# Patient Record
Sex: Female | Born: 1980
Health system: Southern US, Community
[De-identification: ages and names within clinical notes are randomized; demographics above are authoritative.]

## PROBLEM LIST (undated history)

## (undated) DIAGNOSIS — E039 Hypothyroidism, unspecified: Secondary | ICD-10-CM

## (undated) DIAGNOSIS — I10 Essential (primary) hypertension: Secondary | ICD-10-CM

## (undated) DIAGNOSIS — K219 Gastro-esophageal reflux disease without esophagitis: Secondary | ICD-10-CM

## (undated) DIAGNOSIS — T753XXA Motion sickness, initial encounter: Secondary | ICD-10-CM

## (undated) DIAGNOSIS — E079 Disorder of thyroid, unspecified: Secondary | ICD-10-CM

## (undated) HISTORY — DX: Disorder of thyroid, unspecified: E07.9

## (undated) HISTORY — PX: WISDOM TOOTH EXTRACTION: SHX21

---

## 2013-05-27 ENCOUNTER — Ambulatory Visit: Payer: Self-pay

## 2014-05-26 DIAGNOSIS — IMO0002 Reserved for concepts with insufficient information to code with codable children: Secondary | ICD-10-CM

## 2014-05-26 DIAGNOSIS — Z6830 Body mass index (BMI) 30.0-30.9, adult: Secondary | ICD-10-CM | POA: Insufficient documentation

## 2014-05-26 DIAGNOSIS — Z6835 Body mass index (BMI) 35.0-35.9, adult: Secondary | ICD-10-CM | POA: Insufficient documentation

## 2015-02-03 ENCOUNTER — Encounter: Payer: Self-pay | Admitting: Internal Medicine

## 2015-02-03 ENCOUNTER — Other Ambulatory Visit: Payer: Self-pay | Admitting: Internal Medicine

## 2015-02-03 DIAGNOSIS — R739 Hyperglycemia, unspecified: Secondary | ICD-10-CM | POA: Insufficient documentation

## 2015-02-03 DIAGNOSIS — E039 Hypothyroidism, unspecified: Secondary | ICD-10-CM | POA: Insufficient documentation

## 2015-02-03 DIAGNOSIS — J3081 Allergic rhinitis due to animal (cat) (dog) hair and dander: Secondary | ICD-10-CM | POA: Insufficient documentation

## 2015-02-03 HISTORY — DX: Hyperglycemia, unspecified: R73.9

## 2015-03-02 ENCOUNTER — Encounter: Payer: Self-pay | Admitting: Internal Medicine

## 2015-03-02 ENCOUNTER — Ambulatory Visit (INDEPENDENT_AMBULATORY_CARE_PROVIDER_SITE_OTHER): Payer: BLUE CROSS/BLUE SHIELD | Admitting: Internal Medicine

## 2015-03-02 VITALS — BP 110/68 | HR 84 | Ht 70.0 in | Wt 215.6 lb

## 2015-03-02 DIAGNOSIS — R7303 Prediabetes: Secondary | ICD-10-CM

## 2015-03-02 DIAGNOSIS — R739 Hyperglycemia, unspecified: Secondary | ICD-10-CM | POA: Diagnosis not present

## 2015-03-02 DIAGNOSIS — E039 Hypothyroidism, unspecified: Secondary | ICD-10-CM | POA: Diagnosis not present

## 2015-03-02 HISTORY — DX: Prediabetes: R73.03

## 2015-03-02 MED ORDER — LEVOTHYROXINE SODIUM 125 MCG PO TABS: 125.0000 ug | ORAL_TABLET | Freq: Every day | ORAL | Status: DC

## 2015-03-02 NOTE — Progress Notes (Signed)
Date:  03/02/2015   Name:  Alicia Horn   DOB:  [birth year]   MRN:  [id number]   Chief Complaint: Hypothyroidism Thyroid Problem Presents for follow-up visit. Patient reports no anxiety, cold intolerance, constipation, depressed mood, diarrhea, fatigue, leg swelling, menstrual problem or tremors. The symptoms have been stable. Past treatments include levothyroxine. The treatment provided significant relief.  Elevated BS with borderline A1C - previously noted to have borderline blood sugar and A1C 5.8.  She has been working on diet and losing weight steadily.  Her labs have not been checked in at least one year. HM -  She sees GYN at [facility] once a year for Pap, pelvic and mammograms.  She did not have blood work this year.   Review of Systems  Constitutional: Negative for fever, fatigue and unexpected weight change.  HENT: Negative for tinnitus, trouble swallowing and voice change.   Eyes: Negative for visual disturbance.  Respiratory: Negative for chest tightness, shortness of breath and wheezing.   Gastrointestinal: Negative for diarrhea, constipation and blood in stool.  Endocrine: Negative for cold intolerance.  Genitourinary: Negative for menstrual problem.  Musculoskeletal: Negative for back pain and arthralgias.  Neurological: Negative for tremors, light-headedness and headaches.  Psychiatric/Behavioral: The patient is not nervous/anxious.     Patient Active Problem List   Diagnosis Date Noted  . Borderline diabetes mellitus 03/02/2015  . Laboratory animal allergy 02/03/2015  . Acquired hypothyroidism 02/03/2015  . Blood glucose elevated 02/03/2015  . Adult BMI 30+ 05/26/2014    Prior to Admission medications   Medication Sig Start Date End Date Taking? Authorizing Provider  levothyroxine (SYNTHROID, LEVOTHROID) 125 MCG tablet Take 1 tablet by mouth daily. 03/05/14   Historical Provider, MD    Allergies  Allergen Reactions  . Amoxicillin Other (See Comments)  .  Cefuroxime Axetil Diarrhea and Nausea And Vomiting    Other reaction(s): Nausea    No past surgical history on file.  Social History  Substance Use Topics  . Smoking status: Never Smoker   . Smokeless tobacco: None  . Alcohol Use: 1.2 oz/week    2 Standard drinks or equivalent per week    Medication list has been reviewed and updated.   Physical Exam  Constitutional: She is oriented to person, place, and time. She appears well-developed. No distress.  HENT:  Head: Normocephalic and atraumatic.  Right Ear: Tympanic membrane normal.  Left Ear: Tympanic membrane normal.  Mouth/Throat: Oropharynx is clear and moist.  Neck: Normal range of motion. Neck supple. No thyromegaly present.  Cardiovascular: Normal rate, regular rhythm and normal heart sounds.   Pulmonary/Chest: Effort normal and breath sounds normal. No respiratory distress. She has no wheezes.  Musculoskeletal: Normal range of motion. She exhibits no edema.  Lymphadenopathy:    She has no cervical adenopathy.  Neurological: She is alert and oriented to person, place, and time.  Skin: Skin is warm and dry. No rash noted.  Psychiatric: She has a normal mood and affect. Her behavior is normal. Thought content normal.  Nursing note and vitals reviewed.   BP 110/68 mmHg  Pulse 84  Ht 5\' 10"  (1.778 m)  Wt 215 lb 9.6 oz (97.796 kg)  BMI 30.94 kg/m2  Assessment and Plan: 1. Acquired hypothyroidism supplemented - TSH - Lipid panel - levothyroxine (SYNTHROID, LEVOTHROID) 125 MCG tablet; Take 1 tablet (125 mcg total) by mouth daily.  Dispense: 90 tablet; Refill: 3  2. Blood glucose elevated Should be improved with weight loss - Comprehensive  metabolic panel - Hemoglobin A1c   Halina Maidens, MD [facility]  03/02/2015

## 2015-03-03 LAB — COMPREHENSIVE METABOLIC PANEL
A/G RATIO: 1.7 (ref 1.1–2.5)
ALT: 18 IU/L (ref 0–32)
AST: 16 IU/L (ref 0–40)
Albumin: 4.5 g/dL (ref 3.5–5.5)
Alkaline Phosphatase: 61 IU/L (ref 39–117)
BILIRUBIN TOTAL: 0.3 mg/dL (ref 0.0–1.2)
BUN/Creatinine Ratio: 22 — ABNORMAL HIGH (ref 8–20)
BUN: 14 mg/dL (ref 6–20)
CHLORIDE: 98 mmol/L (ref 96–106)
CO2: 22 mmol/L (ref 18–29)
Calcium: 8.7 mg/dL (ref 8.7–10.2)
Creatinine, Ser: 0.65 mg/dL (ref 0.57–1.00)
GFR calc non Af Amer: 115 mL/min/{1.73_m2} (ref >59)
GFR, EST AFRICAN AMERICAN: 132 mL/min/{1.73_m2} (ref >59)
GLOBULIN, TOTAL: 2.6 g/dL (ref 1.5–4.5)
Glucose: 77 mg/dL (ref 65–99)
POTASSIUM: 4.7 mmol/L (ref 3.5–5.2)
SODIUM: 138 mmol/L (ref 134–144)
Total Protein: 7.1 g/dL (ref 6.0–8.5)

## 2015-03-03 LAB — LIPID PANEL
CHOL/HDL RATIO: 3.3 ratio (ref 0.0–4.4)
CHOLESTEROL TOTAL: 182 mg/dL (ref 100–199)
HDL: 55 mg/dL (ref >39)
LDL Calculated: 107 mg/dL — ABNORMAL HIGH (ref 0–99)
TRIGLYCERIDES: 100 mg/dL (ref 0–149)
VLDL Cholesterol Cal: 20 mg/dL (ref 5–40)

## 2015-03-03 LAB — HEMOGLOBIN A1C
ESTIMATED AVERAGE GLUCOSE: 117 mg/dL
Hgb A1c MFr Bld: 5.7 % — ABNORMAL HIGH (ref 4.8–5.6)

## 2015-03-03 LAB — TSH: TSH: 2.35 u[IU]/mL (ref 0.450–4.500)

## 2015-09-21 ENCOUNTER — Encounter: Payer: Self-pay | Admitting: Internal Medicine

## 2015-09-21 ENCOUNTER — Other Ambulatory Visit: Payer: Self-pay | Admitting: Internal Medicine

## 2015-09-21 ENCOUNTER — Other Ambulatory Visit: Payer: Self-pay

## 2015-09-21 ENCOUNTER — Ambulatory Visit (INDEPENDENT_AMBULATORY_CARE_PROVIDER_SITE_OTHER): Payer: BLUE CROSS/BLUE SHIELD | Admitting: Internal Medicine

## 2015-09-21 VITALS — BP 118/81 | HR 81 | Temp 98.4°F | Resp 16 | Ht 70.0 in | Wt 222.0 lb

## 2015-09-21 DIAGNOSIS — K439 Ventral hernia without obstruction or gangrene: Secondary | ICD-10-CM | POA: Diagnosis not present

## 2015-09-21 DIAGNOSIS — L0390 Cellulitis, unspecified: Secondary | ICD-10-CM | POA: Diagnosis not present

## 2015-09-21 LAB — POCT URINALYSIS DIPSTICK
Bilirubin, UA: NEGATIVE
GLUCOSE UA: NEGATIVE
Ketones, UA: NEGATIVE
Leukocytes, UA: NEGATIVE
NITRITE UA: NEGATIVE
PROTEIN UA: NEGATIVE
Spec Grav, UA: 1.02
pH, UA: 6.5

## 2015-09-21 NOTE — Progress Notes (Signed)
    Date:  09/21/2015   Name:  Alicia Horn   DOB:  1981-03-05   MRN:  [id number]   Chief Complaint: Abdominal Pain Abdominal Pain This is a new problem. The current episode started 1 to 4 weeks ago. The onset quality is gradual. The problem occurs daily. The problem has been unchanged. The pain is located in the periumbilical region. The pain is at a severity of 0/10. The quality of the pain is a sensation of fullness and cramping. The abdominal pain does not radiate. Pertinent negatives include no diarrhea, fever, headaches, vomiting or weight loss. The pain is aggravated by movement.      Review of Systems  Constitutional: Negative for fever and weight loss.  Gastrointestinal: Positive for abdominal pain. Negative for vomiting and diarrhea.  Neurological: Negative for headaches.  All other systems reviewed and are negative.   Patient Active Problem List   Diagnosis Date Noted  . Borderline diabetes mellitus 03/02/2015  . Laboratory animal allergy 02/03/2015  . Acquired hypothyroidism 02/03/2015  . Blood glucose elevated 02/03/2015  . Adult BMI 30+ 05/26/2014    Prior to Admission medications   Medication Sig Start Date End Date Taking? Authorizing Provider  levothyroxine (SYNTHROID, LEVOTHROID) 125 MCG tablet Take 1 tablet (125 mcg total) by mouth daily. 03/02/15  Yes Glean Hess, MD    Allergies  Allergen Reactions  . Amoxicillin Other (See Comments)  . Cefuroxime Axetil Diarrhea and Nausea And Vomiting    Other reaction(s): Nausea    History reviewed. No pertinent past surgical history.  Social History  Substance Use Topics  . Smoking status: Never Smoker   . Smokeless tobacco: None  . Alcohol Use: 1.2 oz/week    2 Standard drinks or equivalent per week     Medication list has been reviewed and updated.   Physical Exam  Constitutional: She is oriented to person, place, and time. She appears well-developed. No distress.  HENT:  Head: Normocephalic  and atraumatic.  Cardiovascular: Normal rate, regular rhythm and normal heart sounds.   Pulmonary/Chest: Effort normal. No respiratory distress.  Abdominal: Soft. Normal appearance and bowel sounds are normal. There is no hepatosplenomegaly. There is tenderness in the periumbilical area. There is no CVA tenderness.    Musculoskeletal: Normal range of motion.  Neurological: She is alert and oriented to person, place, and time.  Skin: Skin is warm and dry. No rash noted.  Psychiatric: She has a normal mood and affect. Her behavior is normal. Thought content normal.  Nursing note and vitals reviewed.   BP 118/81 mmHg  Pulse 81  Temp(Src) 98.4 F (36.9 C) (Oral)  Resp 16  Ht 5\' 10"  (1.778 m)  Wt 222 lb (100.699 kg)  BMI 31.85 kg/m2  SpO2 98%  LMP 09/17/2015  Assessment and Plan: 1. Ventral hernia, recurrence not specified Suspected - will refer to Gen Surgery to further evaluate - POCT urinalysis dipstick - Ambulatory referral to General Surgery  2. Cellulitis, unspecified cellulitis site, unspecified extremity site, unspecified laterality Clean with H2O2 and apply TAO daily   Halina Maidens, MD [facility]  09/21/2015

## 2015-09-27 ENCOUNTER — Ambulatory Visit: Payer: Self-pay | Admitting: Surgery

## 2015-09-27 ENCOUNTER — Ambulatory Visit
Admission: RE | Admit: 2015-09-27 | Discharge: 2015-09-27 | Disposition: A | Discharge status: Home / Self Care | Payer: BLUE CROSS/BLUE SHIELD | Source: Ambulatory Visit | Attending: Surgery | Admitting: Surgery

## 2015-09-27 ENCOUNTER — Ambulatory Visit (INDEPENDENT_AMBULATORY_CARE_PROVIDER_SITE_OTHER): Payer: BLUE CROSS/BLUE SHIELD | Admitting: Surgery

## 2015-09-27 ENCOUNTER — Encounter: Payer: Self-pay | Admitting: Surgery

## 2015-09-27 VITALS — BP 110/65 | HR 84 | Temp 98.9°F | Ht 70.0 in | Wt 220.0 lb

## 2015-09-27 DIAGNOSIS — R1012 Left upper quadrant pain: Secondary | ICD-10-CM

## 2015-09-27 DIAGNOSIS — D259 Leiomyoma of uterus, unspecified: Secondary | ICD-10-CM | POA: Insufficient documentation

## 2015-09-27 DIAGNOSIS — R1033 Periumbilical pain: Secondary | ICD-10-CM | POA: Diagnosis not present

## 2015-09-27 MED ORDER — IOPAMIDOL (ISOVUE-370) INJECTION 76%
125.0000 mL | Freq: Once | INTRAVENOUS | Status: AC | PRN
Start: 2015-09-27 — End: 2015-09-27
  Administered 2015-09-27: 125 mL via INTRAVENOUS

## 2015-09-27 NOTE — Progress Notes (Signed)
Surgical Consultation  09/27/2015  Alicia Horn is an 35 y.o. female.   CC: Umbilical pain  HPI: Is a patient with periumbilical pain but no mass is been going on for several months and in fact she's had drainage from her umbilicus on and off since March with no fevers or chills. She did have some erythema apparently at one point but has not been treated with antibiotics. He studies. Her pain comes and goes and she points more to the left of the umbilicus as the site of her pain. He has not noticed a mass  Past Medical History:  Diagnosis Date  . Thyroid disease     Past Surgical History:  Procedure Laterality Date  . WISDOM TOOTH EXTRACTION      Family History  Problem Relation Age of Onset  . Hyperlipidemia Father   . CAD Father     Social History:  reports that she has never smoked. She does not have any smokeless tobacco history on file. She reports that she drinks about 1.2 oz of alcohol per week . Her drug history is not on file.  Allergies:  Allergies  Allergen Reactions  . Amoxicillin Other (See Comments)  . Cefuroxime Axetil Diarrhea and Nausea And Vomiting    Other reaction(s): Nausea    Medications reviewed.   Review of Systems:   Review of Systems  Constitutional: Negative for chills and fever.  HENT: Negative.   Eyes: Negative.   Respiratory: Negative.   Cardiovascular: Negative.   Gastrointestinal: Positive for abdominal pain. Negative for blood in stool, constipation, diarrhea, heartburn, nausea and vomiting.  Genitourinary: Negative.   Musculoskeletal: Negative.   Skin: Negative.   Neurological: Negative.   Endo/Heme/Allergies: Negative.   Psychiatric/Behavioral: Negative.      Physical Exam:  BP 110/65 (BP Location: Right Arm, Patient Position: Sitting, Cuff Size: Large)   Pulse 84   Temp 98.9 F (37.2 C) (Oral)   Ht 5\' 10"  (1.778 m)   Wt 220 lb (99.8 kg)   LMP 09/17/2015   BMI 31.57 kg/m   Physical Exam  Constitutional: She  is oriented to person, place, and time and well-developed, well-nourished, and in no distress. No distress.  HENT:  Head: Normocephalic and atraumatic.  Eyes: Right eye exhibits no discharge. Left eye exhibits no discharge. No scleral icterus.  Neck: Normal range of motion.  Cardiovascular: Normal rate, regular rhythm and normal heart sounds.   Pulmonary/Chest: Effort normal and breath sounds normal. No respiratory distress. She has no wheezes. She has no rales.  Abdominal: Soft. She exhibits no distension. There is no tenderness. There is no rebound and no guarding.   No erythema nontender no mass no obvious hernia Q tip in the depths of the umbilicus demonstrates yellowish purulent fluid of minimal quantity. Blood  Musculoskeletal: Normal range of motion. She exhibits no edema or tenderness.  Lymphadenopathy:    She has no cervical adenopathy.  Neurological: She is alert and oriented to person, place, and time.  Skin: Skin is warm and dry. No rash noted. She is not diaphoretic. No erythema.  Psychiatric: Mood and affect normal.  Vitals reviewed.     No results found for this or any previous visit (from the past 48 hour(s)). No results found.  Assessment/Plan:  Patient with periumbilical pain and drainage with a history of erythema I see no sign of a hernia at this point but no imaging is been performed. This may represent an umbilical granuloma. Other alternatives could be endometriosis  etc. The plan would be to obtain a CT scan to rule out a hernia as she has had not had any abdominal surgery it is unlikely that this would be an infected suture etc. With that in mind she will require exploration but I will discuss that with her after we obtain a CT scan.  Florene Glen, MD, FACS

## 2015-09-29 ENCOUNTER — Ambulatory Visit (INDEPENDENT_AMBULATORY_CARE_PROVIDER_SITE_OTHER): Payer: BLUE CROSS/BLUE SHIELD | Admitting: Surgery

## 2015-09-29 VITALS — BP 124/64 | HR 91 | Temp 98.4°F | Ht 70.0 in | Wt 221.0 lb

## 2015-09-29 DIAGNOSIS — R1033 Periumbilical pain: Secondary | ICD-10-CM | POA: Diagnosis not present

## 2015-09-29 NOTE — Progress Notes (Signed)
Outpatient Surgical Follow Up  09/29/2015  Alicia Horn is an 35 y.o. female.   CC umbilical drainage  HPI: This a patient with ongoing umbilical drainage and periumbilical pain for which there was a concern about a hernia. She had a CT scan performed which has been personally reviewed showing no sign of hernia. Patient's only complaint at this point his pain to the left of her umbilicus and drainage. He has had no abdominal surgery and has never had a problem with endometriosis.  Past Medical History:  Diagnosis Date  . Thyroid disease     Past Surgical History:  Procedure Laterality Date  . WISDOM TOOTH EXTRACTION      Family History  Problem Relation Age of Onset  . Hyperlipidemia Father   . CAD Father     Social History:  reports that she has never smoked. She does not have any smokeless tobacco history on file. She reports that she drinks about 1.2 oz of alcohol per week . Her drug history is not on file.  Allergies:  Allergies  Allergen Reactions  . Amoxicillin Other (See Comments)  . Cefuroxime Axetil Diarrhea and Nausea And Vomiting    Other reaction(s): Nausea    Medications reviewed.   Review of Systems:   Review of Systems  Constitutional: Negative.   HENT: Negative.   Eyes: Negative.   Respiratory: Negative.   Cardiovascular: Negative.   Gastrointestinal: Positive for abdominal pain. Negative for nausea and vomiting.  Genitourinary: Negative.   Musculoskeletal: Negative.   Skin: Negative.   Neurological: Negative.   Endo/Heme/Allergies: Negative.   Psychiatric/Behavioral: Negative.      Physical Exam:  LMP 09/17/2015   Physical Exam  Constitutional: She is oriented to person, place, and time and well-developed, well-nourished, and in no distress. No distress.  HENT:  Head: Normocephalic and atraumatic.  Eyes: Right eye exhibits no discharge. Left eye exhibits no discharge. No scleral icterus.  Neck: Normal range of motion.   Cardiovascular: Normal rate, regular rhythm and normal heart sounds.   Pulmonary/Chest: Effort normal and breath sounds normal. No respiratory distress. She has no wheezes. She has no rales.  Abdominal: Soft. She exhibits no distension. There is no tenderness.  Umbilical drainage with no erythema nontender no mass no hernia  Musculoskeletal: Normal range of motion. She exhibits no edema or tenderness.  Lymphadenopathy:    She has no cervical adenopathy.  Neurological: She is alert and oriented to person, place, and time.  Skin: Skin is warm. No rash noted. She is not diaphoretic. No erythema.  Umbilical drainage  Psychiatric: Mood and affect normal.  Vitals reviewed.     No results found for this or any previous visit (from the past 48 hour(s)). Ct Abdomen Pelvis W Contrast  Result Date: 09/27/2015 CLINICAL DATA:  35 year old female with history of mid abdominal (periumbilical) pain for the past 5 or 6 weeks. No history of trauma. Intermittent drainage from the umbilicus to since March 2017. No fevers or chills. EXAM: CT ABDOMEN AND PELVIS WITH CONTRAST TECHNIQUE: Multidetector CT imaging of the abdomen and pelvis was performed using the standard protocol following bolus administration of intravenous contrast. CONTRAST:  125 mL of Isovue 370. COMPARISON:  No priors. FINDINGS: Lower chest:  Unremarkable. Hepatobiliary: No cystic or solid hepatic lesions. No intra or extrahepatic biliary ductal dilatation. Gallbladder is normal in appearance. Pancreas: No pancreatic mass. No pancreatic ductal dilatation. No pancreatic or peripancreatic fluid or inflammatory changes. Spleen: Unremarkable. Adrenals/Urinary Tract: Bilateral adrenal glands and  bilateral kidneys are normal in appearance. No hydroureteronephrosis. Urinary bladder is normal in appearance. Specifically, no urachal anomaly is noted. Stomach/Bowel: The appearance of the stomach is normal. There is no pathologic dilatation of small bowel or  colon. Normal appendix. Vascular/Lymphatic: No significant atherosclerotic disease, aneurysm or dissection identified in the abdominal or pelvic vasculature. No lymphadenopathy noted in the abdomen or pelvis. Reproductive: Extending inferiorly from the right side of the uterine fundus there is a 3.2 x 3.1 x 3.2 cm heterogeneously enhancing lesion, most compatible with a small fibroid. Bilateral ovaries are unremarkable in appearance. Other: No significant volume of ascites.  No pneumoperitoneum. Musculoskeletal: There are no aggressive appearing lytic or blastic lesions noted in the visualized portions of the skeleton. Umbilical area is unremarkable in appearance. IMPRESSION: 1. No acute findings in the abdomen or pelvis to account for the patient's symptoms. 2. The umbilical area is grossly unremarkable in appearance. 3. No urachal abnormality or other findings to account for the recent drainage from the umbilicus. 4. Normal appendix. 5. Small fibroid in the right-sided the uterine fundus. Electronically Signed   By: Vinnie Langton M.D.   On: 09/27/2015 17:07   Assessment/Plan:  CT scan is personally reviewed showing no sign of hernia but clinically she has an umbilical granuloma which is bothersome painful and draining. It requires excision. This would be performed via a midline incision and elevation of the skin and possible fascial excision. The procedure was described for her in the rationale was discussed the options of observation were reviewed and the risk of bleeding infection cosmetic deformity and recurrence were all discussed with her she understood and agreed to proceed  Florene Glen, MD, FACS

## 2015-09-29 NOTE — Patient Instructions (Signed)
We will schedule your surgery for 10/14/15. Please see Blue-pre-care sheet. Call our office if you have questions or concerns.

## 2015-09-30 ENCOUNTER — Telehealth: Payer: Self-pay | Admitting: Surgery

## 2015-09-30 ENCOUNTER — Encounter
Admission: RE | Admit: 2015-09-30 | Discharge: 2015-09-30 | Disposition: A | Discharge status: Home / Self Care | Payer: BLUE CROSS/BLUE SHIELD | Source: Ambulatory Visit | Attending: Surgery | Admitting: Surgery

## 2015-09-30 DIAGNOSIS — Z01812 Encounter for preprocedural laboratory examination: Secondary | ICD-10-CM | POA: Insufficient documentation

## 2015-09-30 HISTORY — DX: Hypothyroidism, unspecified: E03.9

## 2015-09-30 LAB — CBC WITH DIFFERENTIAL/PLATELET
Basophils Absolute: 0 K/uL (ref 0–0.1)
Basophils Relative: 1 %
Eosinophils Absolute: 0.2 K/uL (ref 0–0.7)
Eosinophils Relative: 2 %
HCT: 41.5 % (ref 35.0–47.0)
Hemoglobin: 14.3 g/dL (ref 12.0–16.0)
Lymphocytes Relative: 20 %
Lymphs Abs: 1.7 K/uL (ref 1.0–3.6)
MCH: 30.1 pg (ref 26.0–34.0)
MCHC: 34.4 g/dL (ref 32.0–36.0)
MCV: 87.5 fL (ref 80.0–100.0)
Monocytes Absolute: 0.5 K/uL (ref 0.2–0.9)
Monocytes Relative: 5 %
Neutro Abs: 6.4 K/uL (ref 1.4–6.5)
Neutrophils Relative %: 72 %
Platelets: 295 K/uL (ref 150–440)
RBC: 4.74 MIL/uL (ref 3.80–5.20)
RDW: 12.8 % (ref 11.5–14.5)
WBC: 8.8 K/uL (ref 3.6–11.0)

## 2015-09-30 LAB — BASIC METABOLIC PANEL
Anion gap: 9 (ref 5–15)
BUN: 13 mg/dL (ref 6–20)
CALCIUM: 9.3 mg/dL (ref 8.9–10.3)
CHLORIDE: 102 mmol/L (ref 101–111)
CO2: 26 mmol/L (ref 22–32)
CREATININE: 0.71 mg/dL (ref 0.44–1.00)
GFR calc Af Amer: >60 mL/min (ref >60)
GFR calc non Af Amer: >60 mL/min (ref >60)
GLUCOSE: 95 mg/dL (ref 65–99)
Potassium: 4.2 mmol/L (ref 3.5–5.1)
Sodium: 137 mmol/L (ref 135–145)

## 2015-09-30 NOTE — Telephone Encounter (Signed)
Pt advised of pre op date/time and sx date. Sx: 10/14/15 with Dr Cooper--Exploratory laparotomy with excision of abdominal mass. Pre op: 09/30/15 @ 2:00pm--office.   Patient made aware to call [phone], between 1-3:00pm the day before surgery, to find out what time to arrive.

## 2015-09-30 NOTE — Patient Instructions (Signed)
Your procedure is scheduled on: Thursday 10/14/15 Report to Day Surgery. 2ND FLOOR MEDICAL MALL ENTRANCE To find out your arrival time please call [phone] between 1PM - 3PM on Wednesday 10/13/15.  Remember: Instructions that are not followed completely may result in serious medical risk, up to and including death, or upon the discretion of your surgeon and anesthesiologist your surgery may need to be rescheduled.    __X__ 1. Do not eat food or drink liquids after midnight. No gum chewing or hard candies.     __X__ 2. No Alcohol for 24 hours before or after surgery.   ____ 3. Bring all medications with you on the day of surgery if instructed.    __X__ 4. Notify your doctor if there is any change in your medical condition     (cold, fever, infections).     Do not wear jewelry, make-up, hairpins, clips or nail polish.  Do not wear lotions, powders, or perfumes.   Do not shave 48 hours prior to surgery. Men may shave face and neck.  Do not bring valuables to the hospital.    [facility] is not responsible for any belongings or valuables.               Contacts, dentures or bridgework may not be worn into surgery.  Leave your suitcase in the car. After surgery it may be brought to your room.  For patients admitted to the hospital, discharge time is determined by your                treatment team.   Patients discharged the day of surgery will not be allowed to drive home.   Please read over the following fact sheets that you were given:   Surgical Site Infection Prevention   __X__ Take these medicines the morning of surgery with A SIP OF WATER:    1. LEVOTHYROXINE  2.   3.   4.  5.  6.  ____ Fleet Enema (as directed)   __X__ Use CHG Soap as directed  ____ Use inhalers on the day of surgery  ____ Stop metformin 2 days prior to surgery    ____ Take 1/2 of usual insulin dose the night before surgery and none on the morning of surgery.   ____ Stop Coumadin/Plavix/aspirin on    ____ Stop Anti-inflammatories on    ____ Stop supplements until after surgery.    ____ Bring C-Pap to the hospital.

## 2015-10-04 ENCOUNTER — Other Ambulatory Visit: Payer: BLUE CROSS/BLUE SHIELD

## 2015-10-05 ENCOUNTER — Telehealth: Payer: Self-pay | Admitting: Surgery

## 2015-10-05 HISTORY — PX: UMBILICAL GRANULOMA EXCISION: SHX2597

## 2015-10-05 NOTE — Telephone Encounter (Signed)
Patient has called and is requesting a letter for work prior to her surgery.   Please fax the letter to the employer -- [employer] @ [phone] --Attention to Mortimer Fries.  Please include the surgery date and an estimated time out of work until the [employer] paperwork can be filled out. Her employer will need this prior to surgery and please call patient once this has been competed.

## 2015-10-05 NOTE — Telephone Encounter (Signed)
Called patient, however was unable to leave message due to mail box being full.  I wanted to let patient know I was faxing over the letter to her employer today.

## 2015-10-11 ENCOUNTER — Ambulatory Visit: Payer: BLUE CROSS/BLUE SHIELD | Admitting: Surgery

## 2015-10-14 ENCOUNTER — Ambulatory Visit: Payer: BLUE CROSS/BLUE SHIELD | Admitting: Anesthesiology

## 2015-10-14 ENCOUNTER — Encounter
Admission: RE | Disposition: A | Discharge status: Home / Self Care | Payer: Self-pay | Source: Ambulatory Visit | Attending: Surgery

## 2015-10-14 ENCOUNTER — Encounter: Payer: Self-pay | Admitting: *Deleted

## 2015-10-14 ENCOUNTER — Ambulatory Visit
Admission: RE | Admit: 2015-10-14 | Discharge: 2015-10-14 | Disposition: A | Discharge status: Home / Self Care | Payer: BLUE CROSS/BLUE SHIELD | Source: Ambulatory Visit | Attending: Surgery | Admitting: Surgery

## 2015-10-14 DIAGNOSIS — L929 Granulomatous disorder of the skin and subcutaneous tissue, unspecified: Secondary | ICD-10-CM | POA: Insufficient documentation

## 2015-10-14 DIAGNOSIS — E039 Hypothyroidism, unspecified: Secondary | ICD-10-CM | POA: Diagnosis not present

## 2015-10-14 DIAGNOSIS — K429 Umbilical hernia without obstruction or gangrene: Secondary | ICD-10-CM | POA: Diagnosis not present

## 2015-10-14 DIAGNOSIS — R1033 Periumbilical pain: Secondary | ICD-10-CM | POA: Diagnosis not present

## 2015-10-14 DIAGNOSIS — L919 Hypertrophic disorder of the skin, unspecified: Secondary | ICD-10-CM | POA: Diagnosis not present

## 2015-10-14 DIAGNOSIS — R109 Unspecified abdominal pain: Secondary | ICD-10-CM | POA: Diagnosis not present

## 2015-10-14 HISTORY — PX: EXPLORATORY LAPAROTOMY WITH ABDOMINAL MASS EXCISION: SHX5169

## 2015-10-14 LAB — POCT PREGNANCY, URINE: Preg Test, Ur: NEGATIVE

## 2015-10-14 SURGERY — LAPAROTOMY, EXPLORATORY, WITH ABDOMINAL MASS EXCISION
Anesthesia: General | Wound class: Clean

## 2015-10-14 MED ORDER — DEXAMETHASONE SODIUM PHOSPHATE 10 MG/ML IJ SOLN
INTRAMUSCULAR | Status: DC | PRN
  Administered 2015-10-14: 10 mg via INTRAVENOUS

## 2015-10-14 MED ORDER — SUCCINYLCHOLINE CHLORIDE 20 MG/ML IJ SOLN
INTRAMUSCULAR | Status: DC | PRN
  Administered 2015-10-14: 100 mg via INTRAVENOUS

## 2015-10-14 MED ORDER — OXYCODONE HCL 5 MG PO TABS
5.0000 mg | ORAL_TABLET | Freq: Once | ORAL | Status: DC | PRN
Start: 2015-10-14 — End: 2015-10-14

## 2015-10-14 MED ORDER — LIDOCAINE HCL (CARDIAC) 20 MG/ML IV SOLN
INTRAVENOUS | Status: DC | PRN
  Administered 2015-10-14: 100 mg via INTRAVENOUS

## 2015-10-14 MED ORDER — CHLORHEXIDINE GLUCONATE 4 % EX LIQD: 60.0000 mL | Freq: Once | CUTANEOUS | Status: DC

## 2015-10-14 MED ORDER — ACETAMINOPHEN 10 MG/ML IV SOLN
INTRAVENOUS | Status: DC | PRN
  Administered 2015-10-14: 1000 mg via INTRAVENOUS

## 2015-10-14 MED ORDER — PROPOFOL 10 MG/ML IV BOLUS
INTRAVENOUS | Status: DC | PRN
  Administered 2015-10-14: 180 mg via INTRAVENOUS

## 2015-10-14 MED ORDER — BUPIVACAINE HCL (PF) 0.25 % IJ SOLN
INTRAMUSCULAR | Status: AC
  Filled 2015-10-14: qty 30

## 2015-10-14 MED ORDER — MIDAZOLAM HCL 2 MG/2ML IJ SOLN
INTRAMUSCULAR | Status: DC | PRN
  Administered 2015-10-14: 2 mg via INTRAVENOUS

## 2015-10-14 MED ORDER — ACETAMINOPHEN 10 MG/ML IV SOLN
INTRAVENOUS | Status: AC
  Filled 2015-10-14: qty 100

## 2015-10-14 MED ORDER — HYDROCODONE-ACETAMINOPHEN 5-300 MG PO TABS: 1.0000 | ORAL_TABLET | ORAL | 0 refills | Status: DC | PRN

## 2015-10-14 MED ORDER — CHLORHEXIDINE GLUCONATE 4 % EX LIQD
60.0000 mL | Freq: Once | CUTANEOUS | Status: AC
  Administered 2015-10-14: 4 via TOPICAL

## 2015-10-14 MED ORDER — HEPARIN SODIUM (PORCINE) 5000 UNIT/ML IJ SOLN
INTRAMUSCULAR | Status: AC
  Administered 2015-10-14: 5000 [IU] via SUBCUTANEOUS
  Filled 2015-10-14: qty 1

## 2015-10-14 MED ORDER — SUGAMMADEX SODIUM 200 MG/2ML IV SOLN
INTRAVENOUS | Status: DC | PRN
  Administered 2015-10-14: 200 mg via INTRAVENOUS

## 2015-10-14 MED ORDER — OXYCODONE HCL 5 MG/5ML PO SOLN: 5.0000 mg | Freq: Once | ORAL | Status: DC | PRN

## 2015-10-14 MED ORDER — BUPIVACAINE-EPINEPHRINE (PF) 0.25% -1:200000 IJ SOLN
INTRAMUSCULAR | Status: DC | PRN
  Administered 2015-10-14: 30 mL via PERINEURAL

## 2015-10-14 MED ORDER — ROCURONIUM BROMIDE 100 MG/10ML IV SOLN
INTRAVENOUS | Status: DC | PRN
  Administered 2015-10-14: 25 mg via INTRAVENOUS
  Administered 2015-10-14 (×2): 5 mg via INTRAVENOUS

## 2015-10-14 MED ORDER — FENTANYL CITRATE (PF) 100 MCG/2ML IJ SOLN
INTRAMUSCULAR | Status: AC
  Administered 2015-10-14: 25 ug via INTRAVENOUS
  Filled 2015-10-14: qty 2

## 2015-10-14 MED ORDER — FAMOTIDINE 20 MG PO TABS
ORAL_TABLET | ORAL | Status: AC
  Administered 2015-10-14: 20 mg via ORAL
  Filled 2015-10-14: qty 1

## 2015-10-14 MED ORDER — LACTATED RINGERS IV SOLN
INTRAVENOUS | Status: DC
  Administered 2015-10-14: 10:00:00 via INTRAVENOUS

## 2015-10-14 MED ORDER — HEPARIN SODIUM (PORCINE) 5000 UNIT/ML IJ SOLN
5000.0000 [IU] | Freq: Once | INTRAMUSCULAR | Status: AC
  Administered 2015-10-14: 5000 [IU] via SUBCUTANEOUS

## 2015-10-14 MED ORDER — FAMOTIDINE 20 MG PO TABS
20.0000 mg | ORAL_TABLET | Freq: Once | ORAL | Status: AC
  Administered 2015-10-14: 20 mg via ORAL

## 2015-10-14 MED ORDER — BUPIVACAINE-EPINEPHRINE (PF) 0.25% -1:200000 IJ SOLN
INTRAMUSCULAR | Status: AC
  Filled 2015-10-14: qty 30

## 2015-10-14 MED ORDER — FENTANYL CITRATE (PF) 100 MCG/2ML IJ SOLN
INTRAMUSCULAR | Status: DC | PRN
  Administered 2015-10-14 (×2): 50 ug via INTRAVENOUS
  Administered 2015-10-14: 100 ug via INTRAVENOUS
  Administered 2015-10-14: 50 ug via INTRAVENOUS

## 2015-10-14 MED ORDER — CIPROFLOXACIN IN D5W 400 MG/200ML IV SOLN
400.0000 mg | INTRAVENOUS | Status: AC
  Administered 2015-10-14: 400 mg via INTRAVENOUS

## 2015-10-14 MED ORDER — CIPROFLOXACIN IN D5W 400 MG/200ML IV SOLN
INTRAVENOUS | Status: AC
  Filled 2015-10-14: qty 200

## 2015-10-14 MED ORDER — FENTANYL CITRATE (PF) 100 MCG/2ML IJ SOLN
25.0000 ug | INTRAMUSCULAR | Status: DC | PRN
  Administered 2015-10-14 (×4): 25 ug via INTRAVENOUS

## 2015-10-14 MED ORDER — ONDANSETRON HCL 4 MG/2ML IJ SOLN
INTRAMUSCULAR | Status: DC | PRN
  Administered 2015-10-14: 4 mg via INTRAVENOUS

## 2015-10-14 MED ORDER — KETOROLAC TROMETHAMINE 30 MG/ML IJ SOLN
INTRAMUSCULAR | Status: DC | PRN
  Administered 2015-10-14: 30 mg via INTRAVENOUS

## 2015-10-14 SURGICAL SUPPLY — 34 items
BLADE SURG 15 STRL LF DISP TIS (BLADE) ×1 IMPLANT
BLADE SURG 15 STRL SS (BLADE) ×1
CANISTER SUCT 1200ML W/VALVE (MISCELLANEOUS) ×2 IMPLANT
CATH TRAY 16F METER LATEX (MISCELLANEOUS) IMPLANT
CHLORAPREP W/TINT 26ML (MISCELLANEOUS) ×2 IMPLANT
DRAPE LAPAROTOMY 100X77 ABD (DRAPES) ×2 IMPLANT
DRSG TELFA 3X8 NADH (GAUZE/BANDAGES/DRESSINGS) IMPLANT
ELECT REM PT RETURN 9FT ADLT (ELECTROSURGICAL) ×2
ELECTRODE REM PT RTRN 9FT ADLT (ELECTROSURGICAL) ×1 IMPLANT
GAUZE SPONGE 4X4 12PLY STRL (GAUZE/BANDAGES/DRESSINGS) ×2 IMPLANT
GLOVE BIO SURGEON STRL SZ8 (GLOVE) ×4 IMPLANT
GOWN STRL REUS W/ TWL LRG LVL3 (GOWN DISPOSABLE) ×2 IMPLANT
GOWN STRL REUS W/TWL LRG LVL3 (GOWN DISPOSABLE) ×2
KIT RM TURNOVER STRD PROC AR (KITS) ×2 IMPLANT
LABEL OR SOLS (LABEL) IMPLANT
NDL SAFETY 22GX1.5 (NEEDLE) ×2 IMPLANT
NS IRRIG 1000ML POUR BTL (IV SOLUTION) ×2 IMPLANT
PACK COLON CLEAN CLOSURE (MISCELLANEOUS) IMPLANT
SEPRAFILM MEMBRANE 5X6 (MISCELLANEOUS) ×2 IMPLANT
SPONGE LAP 18X18 5 PK (GAUZE/BANDAGES/DRESSINGS) ×2 IMPLANT
SPONGE VERSALON 4X4 4PLY (MISCELLANEOUS) ×2 IMPLANT
STAPLER SKIN PROX 35W (STAPLE) IMPLANT
SUT ETHIBOND CT1 BRD #0 30IN (SUTURE) ×6 IMPLANT
SUT MNCRL 4-0 (SUTURE) ×1
SUT MNCRL 4-0 27XMFL (SUTURE) ×1
SUT PDS AB 1 TP1 54 (SUTURE) IMPLANT
SUT PROLENE 0 CT 1 30 (SUTURE) IMPLANT
SUT SILK 3-0 (SUTURE) ×2 IMPLANT
SUT VIC AB 3-0 SH 27 (SUTURE) ×3
SUT VIC AB 3-0 SH 27X BRD (SUTURE) ×3 IMPLANT
SUT VICRYL 2 0 18  UND BR (SUTURE) ×1
SUT VICRYL 2 0 18 UND BR (SUTURE) ×1 IMPLANT
SUTURE MNCRL 4-0 27XMF (SUTURE) ×1 IMPLANT
SYRINGE 10CC LL (SYRINGE) ×2 IMPLANT

## 2015-10-14 NOTE — Discharge Instructions (Signed)
Remove dressing in 24 hours. °May shower in 24 hours. °Leave paper strips in place. °Resume all home medications. °Follow-up with Dr. Cooper in 10 days. ° °AMBULATORY SURGERY  °DISCHARGE INSTRUCTIONS ° ° °1) The drugs that you were given will stay in your system until tomorrow so for the next 24 hours you should not: ° °A) Drive an automobile °B) Make any legal decisions °C) Drink any alcoholic beverage ° ° °2) You may resume regular meals tomorrow.  Today it is better to start with liquids and gradually work up to solid foods. ° °You may eat anything you prefer, but it is better to start with liquids, then soup and crackers, and gradually work up to solid foods. ° ° °3) Please notify your doctor immediately if you have any unusual bleeding, trouble breathing, redness and pain at the surgery site, drainage, fever, or pain not relieved by medication. ° ° ° °4) Additional Instructions: ° ° ° ° ° ° ° °Please contact your physician with any problems or Same Day Surgery at [phone], Monday through Friday 6 am to 4 pm, or [place] at [address] Main number at [phone]. °

## 2015-10-14 NOTE — Anesthesia Procedure Notes (Signed)
Procedure Name: Intubation Performed by: Kindall Swaby Pre-anesthesia Checklist: Patient identified, Patient being monitored, Timeout performed, Emergency Drugs available and Suction available Patient Re-evaluated:Patient Re-evaluated prior to inductionOxygen Delivery Method: Circle system utilized Preoxygenation: Pre-oxygenation with 100% oxygen Intubation Type: IV induction Ventilation: Mask ventilation without difficulty Laryngoscope Size: Mac and 3 Grade View: Grade II Tube type: Oral Tube size: 7.0 mm Number of attempts: 1 Airway Equipment and Method: Stylet Placement Confirmation: ETT inserted through vocal cords under direct vision,  positive ETCO2 and breath sounds checked- equal and bilateral Secured at: 21 cm Tube secured with: Tape Dental Injury: Teeth and Oropharynx as per pre-operative assessment        

## 2015-10-14 NOTE — Op Note (Signed)
Abdominal Hernia Repair  Pre-operative Diagnosis: Umbilical granuloma  Post-operative Diagnosis: Umbilical granuloma  Surgeon: Delfino Lovett E. Burt Knack, MD FACS  Anesthesia: Gen. with endotracheal tube  Assistant surgical tech  Procedure: Abdominal wall exploration excision of granuloma and closure of abdominal fascia primarily.  Procedure Details  The patient was seen again in the Holding Room. The benefits, complications, treatment options, and expected outcomes were discussed with the patient. The risks of bleeding, infection, recurrence of symptoms, failure to resolve symptoms, bowel injury, mesh placement, mesh infection, any of which could require further surgery were reviewed with the patient. The likelihood of improving the patient's symptoms with return to their baseline status is good.  The patient and/or family concurred with the proposed plan, giving informed consent.  The patient was taken to Operating Room, identified as Alicia Horn and the procedure verified.  A Time Out was held and the above information confirmed.  Prior to the induction of general anesthesia, antibiotic prophylaxis was administered. VTE prophylaxis was in place. General endotracheal anesthesia was then administered and tolerated well. After the induction, the abdomen was prepped with Chloraprep and draped in the sterile fashion. The patient was positioned in the supine position.  A curvilinear incision was made in the infraumbilical area and the skin of the umbilicus was elevated off the fascia it was clear that there was a small umbilical granuloma which was visualized and excised from the skin of the umbilicus. On the fascia was a very similar appearing granulation tissue which was excised from the fascia which left a small subcentimeter defect in the fascia. No further granuloma was identified. The defect in the fascia was closed with interrupted figure-of-eight 0 Ethibonds to repair the resultant fascial  defect.  Alicia Horn is afraid skin and subcutaneous tissues tissues for a total of 30 cc. The skin of the umbilicus was closed internally with figure-of-eight imbricating type 3-0 Vicryl then the skin of the umbilicus was tacked back to the fascia with 3-0 Vicryl figure-of-eight sutures deep sutures of 3-0 Vicryl were placed and then 40 septic or Monocryl was used at all skin edges  Steri-Strips Mastisol and sterile dressings were placed  Patient out of the procedure well the workup occasions she was taken to recovery room in stable condition to be discharged care family and follow-up in 10 days  Findings:  Granuloma  Estimated Blood Loss: Minimal         Drains: None         Specimens: Umbilical granuloma skin and fascia        Complications: none               Alicia Manera E. Burt Knack, MD, FACS

## 2015-10-14 NOTE — Anesthesia Preprocedure Evaluation (Signed)
Anesthesia Evaluation  Patient identified by MRN, date of birth, ID band Patient awake    Reviewed: Allergy & Precautions, H&P , NPO status , Patient's Chart, lab work & pertinent test results  History of Anesthesia Complications Negative for: history of anesthetic complications  Airway Mallampati: II  TM Distance: >3 FB Neck ROM: full    Dental  (+) Poor Dentition   Pulmonary neg pulmonary ROS, neg shortness of breath,    Pulmonary exam normal breath sounds clear to auscultation       Cardiovascular Exercise Tolerance: Good negative cardio ROS Normal cardiovascular exam Rhythm:regular Rate:Normal     Neuro/Psych negative neurological ROS  negative psych ROS   GI/Hepatic negative GI ROS, Neg liver ROS, neg GERD  ,  Endo/Other  Hypothyroidism   Renal/GU negative Renal ROS  negative genitourinary   Musculoskeletal   Abdominal   Peds  Hematology negative hematology ROS (+)   Anesthesia Other Findings Past Medical History: No date: Hypothyroidism No date: Thyroid disease  Past Surgical History: No date: WISDOM TOOTH EXTRACTION     Reproductive/Obstetrics negative OB ROS                             Anesthesia Physical Anesthesia Plan  ASA: III  Anesthesia Plan: General ETT   Post-op Pain Management:    Induction:   Airway Management Planned:   Additional Equipment:   Intra-op Plan:   Post-operative Plan:   Informed Consent: I have reviewed the patients History and Physical, chart, labs and discussed the procedure including the risks, benefits and alternatives for the proposed anesthesia with the patient or authorized representative who has indicated his/her understanding and acceptance.   Dental Advisory Given  Plan Discussed with: Anesthesiologist, CRNA and Surgeon  Anesthesia Plan Comments:         Anesthesia Quick Evaluation

## 2015-10-14 NOTE — Transfer of Care (Signed)
Immediate Anesthesia Transfer of Care Note  Patient: Alicia Horn  Procedure(s) Performed: Procedure(s): EXPLORATORY LAPAROTOMY WITH EXCISION OF ABDOMINAL MASS (N/A)  Patient Location: PACU  Anesthesia Type:General  Level of Consciousness: awake and alert   Airway & Oxygen Therapy: Patient Spontanous Breathing and Patient connected to face mask oxygen  Post-op Assessment: Report given to RN and Post -op Vital signs reviewed and stable  Post vital signs: Reviewed and stable  Last Vitals:  Vitals:   10/14/15 1015  BP: 133/74  Pulse: 99  Resp: 16  Temp: 36.8 C    Last Pain:  Vitals:   10/14/15 1015  TempSrc: Tympanic         Complications: No apparent anesthesia complications

## 2015-10-14 NOTE — Anesthesia Postprocedure Evaluation (Signed)
Anesthesia Post Note  Patient: Alicia Horn  Procedure(s) Performed: Procedure(s) (LRB): EXPLORATORY LAPAROTOMY WITH EXCISION OF ABDOMINAL MASS (N/A)  Patient location during evaluation: PACU Anesthesia Type: General Level of consciousness: awake Pain management: satisfactory to patient Vital Signs Assessment: post-procedure vital signs reviewed and stable Respiratory status: nonlabored ventilation Cardiovascular status: stable Anesthetic complications: no    Last Vitals:  Vitals:   10/14/15 1247 10/14/15 1306  BP:  131/77  Pulse:  84  Resp:  16  Temp: 36.9 C 36.8 C    Last Pain:  Vitals:   10/14/15 1306  TempSrc: Oral  PainSc:                  VAN STAVEREN,Kent [place]

## 2015-10-14 NOTE — Progress Notes (Signed)
Preoperative Review   Patient is met in the preoperative holding area. The history is reviewed in the chart and with the patient. I personally reviewed the options and rationale as well as the risks of this procedure that have been previously discussed with the patient. All questions asked by the patient and/or family were answered to their satisfaction. Patient was examined and I discussed specifically the risks of bleeding infection recurrence and cosmetic deformity she understood and agreed to proceed Patient agrees to proceed with this procedure at this time.  Florene Glen M.D. FACS

## 2015-10-15 LAB — SURGICAL PATHOLOGY

## 2015-10-17 ENCOUNTER — Encounter: Payer: Self-pay | Admitting: Internal Medicine

## 2015-10-19 ENCOUNTER — Telehealth: Payer: Self-pay

## 2015-10-19 NOTE — Telephone Encounter (Signed)
FMLA forms were filled out and faxed.

## 2015-10-22 ENCOUNTER — Ambulatory Visit (INDEPENDENT_AMBULATORY_CARE_PROVIDER_SITE_OTHER): Payer: BLUE CROSS/BLUE SHIELD | Admitting: Surgery

## 2015-10-22 ENCOUNTER — Encounter: Payer: Self-pay | Admitting: Surgery

## 2015-10-22 VITALS — BP 137/90 | HR 105 | Temp 98.4°F | Ht 70.0 in | Wt 226.2 lb

## 2015-10-22 DIAGNOSIS — R1033 Periumbilical pain: Secondary | ICD-10-CM

## 2015-10-22 NOTE — Progress Notes (Signed)
Outpatient postop visit  10/22/2015  Alicia Horn is an 35 y.o. female.    Procedure: Excision of umbilical granuloma  CC: No complaints  HPI: Patient is status post excision of an umbilical granuloma she has no pain she has not been taking any narcotics for several days. She did develop a rash 2 days ago but she had not been taking any medications at that time and attributes it to a food allergy. She has not taken any medications such as Benadryl to improve the rationale of her she states going away on its own  Medications reviewed.    Physical Exam:  BP 137/90 (BP Location: Left Arm, Patient Position: Sitting, Cuff Size: Normal)   Pulse (!) 105   Temp 98.4 F (36.9 C) (Oral)   Ht 5\' 10"  (1.778 m)   Wt 226 lb 3.2 oz (102.6 kg)   LMP 09/17/2015 (Approximate)   BMI 32.46 kg/m     PE: Rash Abdomen is soft nontender wounds healing well no erythema no drainage some mild resolving ecchymosis    Assessment/Plan:  Pathology is reviewed with the patient umbilical granuloma confirmed. She's doing well at this point we'll follow-up on an as-needed basis but we can give her appointment in 2 weeks and she can cancel should this not be completely healed by then.  Florene Glen, MD, FACS

## 2015-10-22 NOTE — Patient Instructions (Signed)
Please call our office if you have questions or concerns. Please see your follow up appointment listed below.   

## 2015-11-02 ENCOUNTER — Ambulatory Visit (INDEPENDENT_AMBULATORY_CARE_PROVIDER_SITE_OTHER): Payer: BLUE CROSS/BLUE SHIELD | Admitting: Surgery

## 2015-11-02 ENCOUNTER — Encounter: Payer: Self-pay | Admitting: Surgery

## 2015-11-02 VITALS — BP 126/68 | HR 90 | Temp 98.5°F | Ht 70.0 in | Wt 220.5 lb

## 2015-11-02 DIAGNOSIS — R1033 Periumbilical pain: Secondary | ICD-10-CM

## 2015-11-02 NOTE — Progress Notes (Signed)
Patient returns today for wound check she has no complaints the left-sided abdominal pain that brought her as her chief complaint before in addition to the drainage has not resolved however. They vomiting fevers or chills and is not noticing any further drainage from her umbilicus  Physical exam demonstrates no drainage from the umbilicus no erythema wound is healing well no erythema no drainage from the wound.  Patient status post excision of a umbilical granuloma. Her wound is healed well there is no further drainage from the granuloma. However her left-sided abdominal pain has not abated it is minimal and not causing her to change her lifestyle in any way. She had a complete workup including a CT scan and an expiration that failed to identify any sort of problem in the areas 3 or 4 cm from her umbilicus. So the etiology of this is still unclear but with a CT scan and the exploration I see no obvious surgically correctable abnormality. This may be muscle strain etc. but her umbilical granuloma clearly required surgery for the drainage and it has been adequately ablated. Patient will return on an as-needed basis

## 2015-11-09 ENCOUNTER — Encounter: Payer: Self-pay | Admitting: Internal Medicine

## 2015-11-10 ENCOUNTER — Other Ambulatory Visit: Payer: Self-pay | Admitting: Internal Medicine

## 2015-11-10 MED ORDER — SCOPOLAMINE 1 MG/3DAYS TD PT72: 1.0000 | MEDICATED_PATCH | TRANSDERMAL | 0 refills | Status: DC

## 2015-11-15 ENCOUNTER — Other Ambulatory Visit: Payer: Self-pay

## 2015-11-17 ENCOUNTER — Ambulatory Visit: Payer: Self-pay | Admitting: Internal Medicine

## 2015-11-17 ENCOUNTER — Other Ambulatory Visit: Payer: Self-pay

## 2015-11-17 DIAGNOSIS — E039 Hypothyroidism, unspecified: Secondary | ICD-10-CM | POA: Diagnosis not present

## 2015-11-18 LAB — TSH: TSH: 2.09 u[IU]/mL (ref 0.450–4.500)

## 2016-02-15 ENCOUNTER — Other Ambulatory Visit: Payer: Self-pay | Admitting: Internal Medicine

## 2016-02-15 ENCOUNTER — Encounter: Payer: Self-pay | Admitting: Internal Medicine

## 2016-02-15 DIAGNOSIS — E039 Hypothyroidism, unspecified: Secondary | ICD-10-CM

## 2016-02-18 ENCOUNTER — Other Ambulatory Visit: Payer: Self-pay | Admitting: Internal Medicine

## 2016-02-18 DIAGNOSIS — E039 Hypothyroidism, unspecified: Secondary | ICD-10-CM

## 2016-02-18 MED ORDER — LEVOTHYROXINE SODIUM 125 MCG PO TABS: 125.0000 ug | ORAL_TABLET | Freq: Every day | ORAL | 3 refills | Status: DC

## 2016-06-06 DIAGNOSIS — Z124 Encounter for screening for malignant neoplasm of cervix: Secondary | ICD-10-CM | POA: Diagnosis not present

## 2016-06-06 DIAGNOSIS — Z01419 Encounter for gynecological examination (general) (routine) without abnormal findings: Secondary | ICD-10-CM | POA: Diagnosis not present

## 2016-06-06 DIAGNOSIS — Z1151 Encounter for screening for human papillomavirus (HPV): Secondary | ICD-10-CM | POA: Diagnosis not present

## 2016-06-06 DIAGNOSIS — R87612 Low grade squamous intraepithelial lesion on cytologic smear of cervix (LGSIL): Secondary | ICD-10-CM | POA: Diagnosis not present

## 2016-06-08 DIAGNOSIS — R87612 Low grade squamous intraepithelial lesion on cytologic smear of cervix (LGSIL): Secondary | ICD-10-CM | POA: Insufficient documentation

## 2016-06-28 DIAGNOSIS — N72 Inflammatory disease of cervix uteri: Secondary | ICD-10-CM | POA: Diagnosis not present

## 2016-06-28 DIAGNOSIS — R87612 Low grade squamous intraepithelial lesion on cytologic smear of cervix (LGSIL): Secondary | ICD-10-CM | POA: Diagnosis not present

## 2016-06-28 DIAGNOSIS — R87810 Cervical high risk human papillomavirus (HPV) DNA test positive: Secondary | ICD-10-CM | POA: Diagnosis not present

## 2016-11-24 ENCOUNTER — Encounter: Payer: Self-pay | Admitting: Internal Medicine

## 2016-12-01 ENCOUNTER — Ambulatory Visit (INDEPENDENT_AMBULATORY_CARE_PROVIDER_SITE_OTHER): Payer: BLUE CROSS/BLUE SHIELD | Admitting: Internal Medicine

## 2016-12-01 ENCOUNTER — Encounter: Payer: Self-pay | Admitting: Internal Medicine

## 2016-12-01 VITALS — BP 102/74 | HR 100 | Ht 70.0 in | Wt 228.0 lb

## 2016-12-01 DIAGNOSIS — E039 Hypothyroidism, unspecified: Secondary | ICD-10-CM | POA: Diagnosis not present

## 2016-12-01 NOTE — Progress Notes (Signed)
Date:  12/01/2016   Name:  Alicia Horn   DOB:  1980-06-28   MRN:  [id number]   Chief Complaint: Hypothyroidism Thyroid Problem  Presents for follow-up visit. Patient reports no constipation, diaphoresis, diarrhea, hoarse voice, leg swelling, palpitations, visual change or weight loss. The symptoms have been stable.   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 Reviewing requirements she would need to have a flu shot, TDaP, quantiferon gold or 2 TB skin tests and varicella titre.  She does not want to do this today but may in the near future.  Review of Systems  Constitutional: Negative for diaphoresis, fever, unexpected weight change and weight loss.  HENT: Negative for hoarse voice.   Eyes: Negative for visual disturbance.  Respiratory: Negative for chest tightness, shortness of breath and wheezing.   Cardiovascular: Negative for chest pain, palpitations and leg swelling.  Gastrointestinal: Negative for constipation and diarrhea.  Musculoskeletal: Negative for arthralgias.  Skin: Negative for rash.  Psychiatric/Behavioral: Negative for sleep disturbance.    Patient Active Problem List   Diagnosis Date Noted  . Umbilical hernia without obstruction and without gangrene   . Umbilical granuloma   . Borderline diabetes mellitus 03/02/2015  . Laboratory animal allergy 02/03/2015  . Acquired hypothyroidism 02/03/2015  . Blood glucose elevated 02/03/2015  . Adult BMI 30+ 05/26/2014    Prior to Admission medications   Medication Sig Start Date End Date Taking? Authorizing Provider  levothyroxine (SYNTHROID, LEVOTHROID) 125 MCG tablet Take 1 tablet (125 mcg total) by mouth daily. 02/18/16  Yes Glean Hess, MD    Allergies  Allergen Reactions  . Acyclovir And Related   . Amoxicillin Other (See Comments)  . Cefuroxime Axetil Diarrhea and Nausea And Vomiting    Other reaction(s): Nausea    Past Surgical History:  Procedure Laterality Date  .  EXPLORATORY LAPAROTOMY WITH ABDOMINAL MASS EXCISION N/A 10/14/2015   excision of umbilical granuloma  . WISDOM TOOTH EXTRACTION      Social History  Substance Use Topics  . Smoking status: Never Smoker  . Smokeless tobacco: Never Used  . Alcohol use 1.2 oz/week    2 Standard drinks or equivalent per week     Medication list has been reviewed and updated.  PHQ 2/9 Scores 12/01/2016 09/21/2015  PHQ - 2 Score 0 0    Physical Exam  Constitutional: She is oriented to person, place, and time. She appears well-developed. No distress.  HENT:  Head: Normocephalic and atraumatic.  Neck: Normal range of motion. Neck supple. No thyromegaly present.  Cardiovascular: Normal rate, regular rhythm and normal heart sounds.   Pulmonary/Chest: Effort normal and breath sounds normal. No respiratory distress. She has no wheezes.  Musculoskeletal: She exhibits no edema.  Neurological: She is alert and oriented to person, place, and time. She has normal reflexes.  Skin: Skin is warm and dry. No rash noted.  Psychiatric: She has a normal mood and affect. Her behavior is normal. Thought content normal.  Nursing note and vitals reviewed.   BP 102/74 (BP Location: Right Arm, Patient Position: Sitting, Cuff Size: Large)   Pulse 100   Ht 5\' 10"  (1.778 m)   Wt 228 lb (103.4 kg)   LMP 11/21/2016   SpO2 97%   BMI 32.71 kg/m   Assessment and Plan: 1. Acquired hypothyroidism Check labs and adjust as needed - TSH   No orders of the defined types were placed in this encounter.   Partially dictated  using Editor, commissioning. Any errors are unintentional.  Halina Maidens, MD [facility]  12/01/2016

## 2016-12-02 LAB — TSH: TSH: 2.36 u[IU]/mL (ref 0.450–4.500)

## 2016-12-25 DIAGNOSIS — H5203 Hypermetropia, bilateral: Secondary | ICD-10-CM | POA: Diagnosis not present

## 2017-01-15 ENCOUNTER — Other Ambulatory Visit: Payer: Self-pay | Admitting: Internal Medicine

## 2017-01-15 DIAGNOSIS — E039 Hypothyroidism, unspecified: Secondary | ICD-10-CM

## 2017-04-04 DIAGNOSIS — H3562 Retinal hemorrhage, left eye: Secondary | ICD-10-CM | POA: Diagnosis not present

## 2017-06-12 DIAGNOSIS — Z1151 Encounter for screening for human papillomavirus (HPV): Secondary | ICD-10-CM | POA: Diagnosis not present

## 2017-06-12 DIAGNOSIS — R87612 Low grade squamous intraepithelial lesion on cytologic smear of cervix (LGSIL): Secondary | ICD-10-CM | POA: Diagnosis not present

## 2017-06-12 DIAGNOSIS — Z124 Encounter for screening for malignant neoplasm of cervix: Secondary | ICD-10-CM | POA: Diagnosis not present

## 2017-06-12 DIAGNOSIS — Z01419 Encounter for gynecological examination (general) (routine) without abnormal findings: Secondary | ICD-10-CM | POA: Diagnosis not present

## 2017-06-12 DIAGNOSIS — E669 Obesity, unspecified: Secondary | ICD-10-CM | POA: Diagnosis not present

## 2017-06-19 DIAGNOSIS — Z3009 Encounter for other general counseling and advice on contraception: Secondary | ICD-10-CM | POA: Diagnosis not present

## 2017-06-19 DIAGNOSIS — N72 Inflammatory disease of cervix uteri: Secondary | ICD-10-CM | POA: Diagnosis not present

## 2017-06-19 DIAGNOSIS — R87612 Low grade squamous intraepithelial lesion on cytologic smear of cervix (LGSIL): Secondary | ICD-10-CM | POA: Diagnosis not present

## 2017-06-19 DIAGNOSIS — R87810 Cervical high risk human papillomavirus (HPV) DNA test positive: Secondary | ICD-10-CM | POA: Diagnosis not present

## 2017-07-31 DIAGNOSIS — Z30430 Encounter for insertion of intrauterine contraceptive device: Secondary | ICD-10-CM | POA: Diagnosis not present

## 2017-07-31 DIAGNOSIS — Z3200 Encounter for pregnancy test, result unknown: Secondary | ICD-10-CM | POA: Diagnosis not present

## 2017-07-31 DIAGNOSIS — Z113 Encounter for screening for infections with a predominantly sexual mode of transmission: Secondary | ICD-10-CM | POA: Diagnosis not present

## 2017-08-06 IMAGING — CT CT ABD-PELV W/ CM
2 of 4 series · 16 of 46 positions shown, 18 images · IV contrast (isovue)
Comparison: No priors.

CLINICAL DATA: 35-year-old female with history of mid abdominal
(periumbilical) pain for the past 5 or 6 weeks. No history of
trauma. Intermittent drainage from the umbilicus to since May 2015. No fevers or chills.

EXAM:
CT ABDOMEN AND PELVIS WITH CONTRAST
TECHNIQUE: Multidetector CT imaging of the abdomen and pelvis was performed
using the standard protocol following bolus administration of
intravenous contrast.
CONTRAST:  125 mL of Isovue 370.

[Series 2: axial soft tissue · axial · 0.71mm/px · z∈[-922,-447]mm · 13 of 105 slices shown, 15 images]
[im 5/105  soft-tissue]
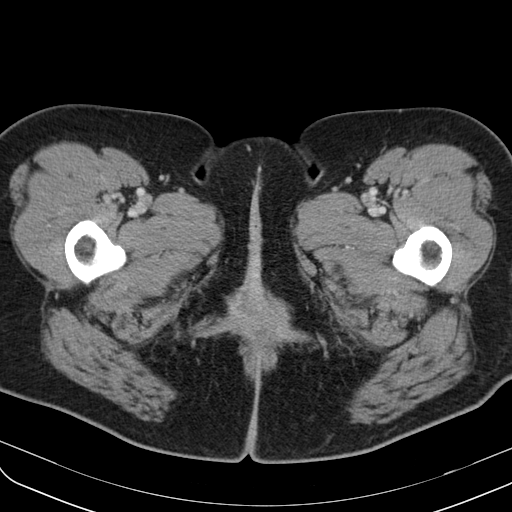
[im 5/105  bone]
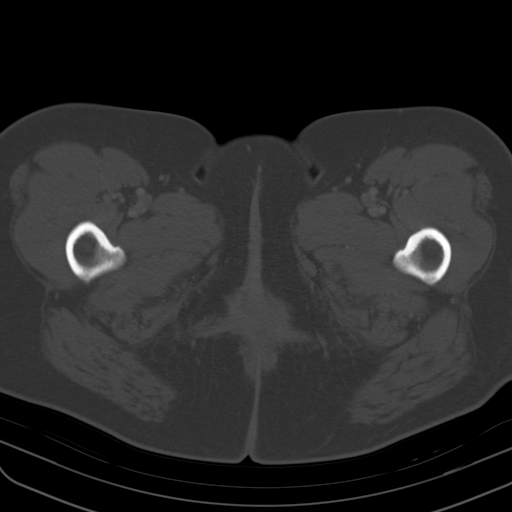
[im 14/105  soft-tissue]
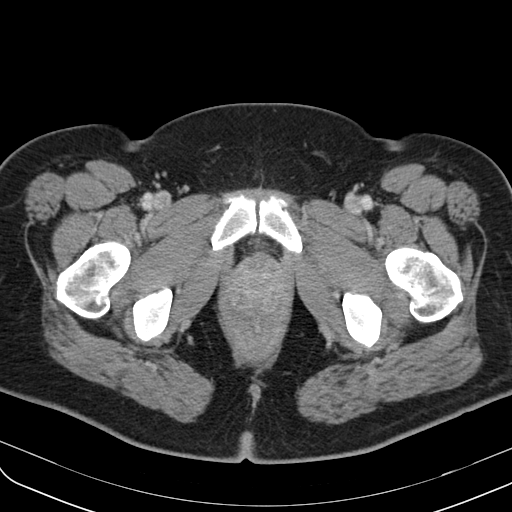
[im 22/105  soft-tissue]
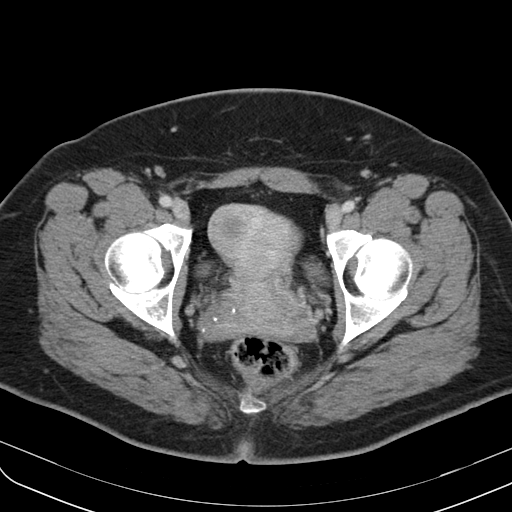
[im 31/105  soft-tissue]
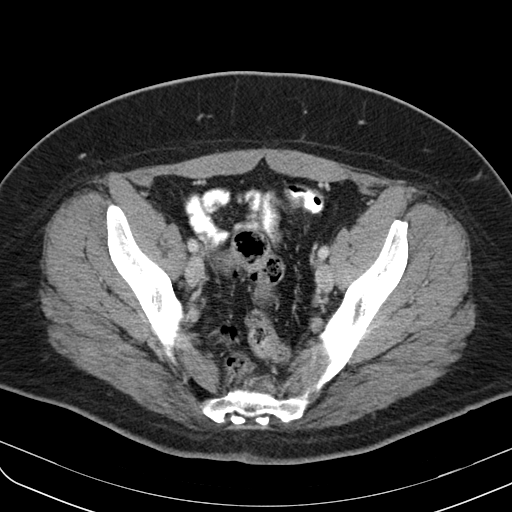
[im 35/105  soft-tissue]
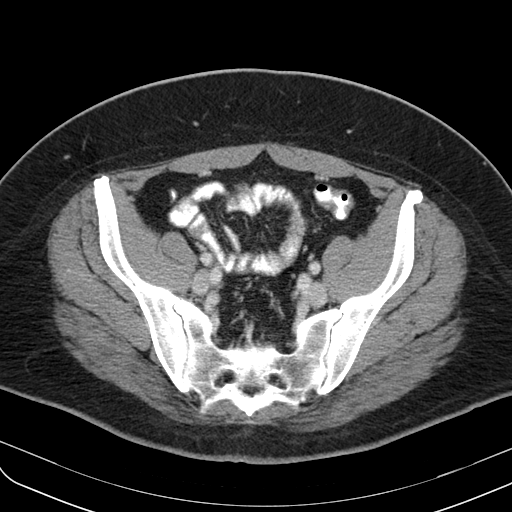
[im 44/105  soft-tissue]
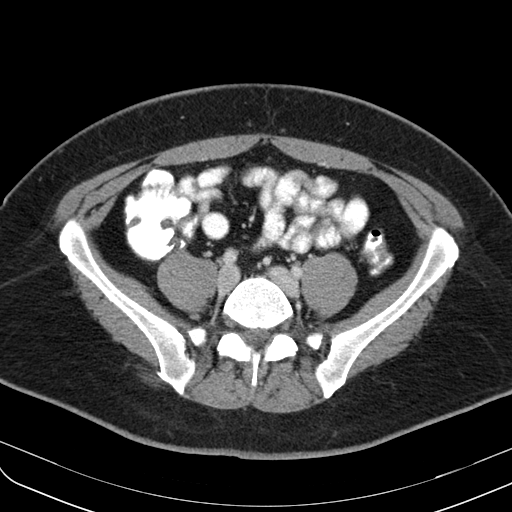
[im 53/105  soft-tissue]
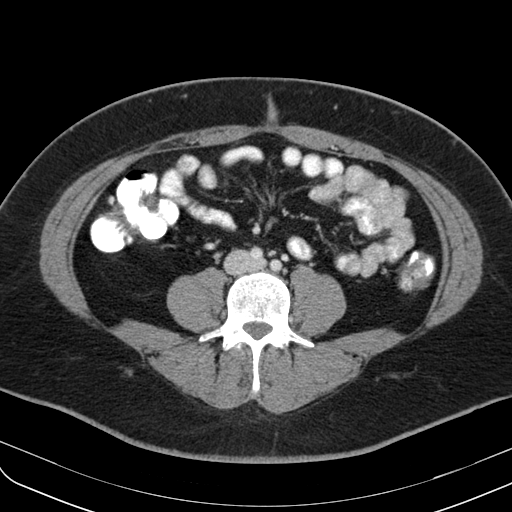
[im 61/105  soft-tissue]
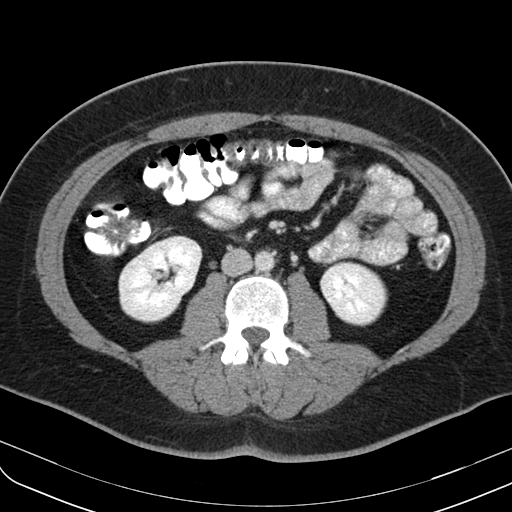
[im 70/105  soft-tissue]
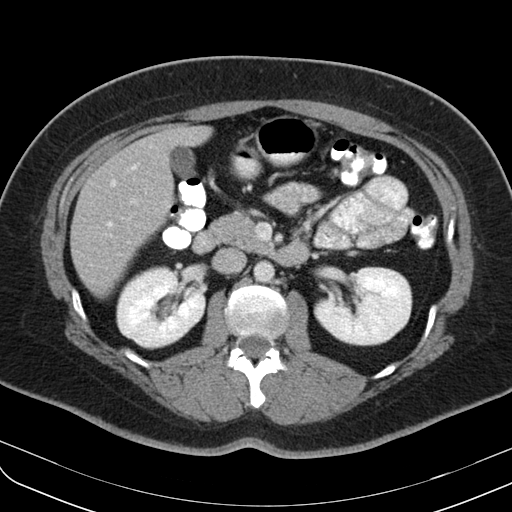
[im 70/105  bone]
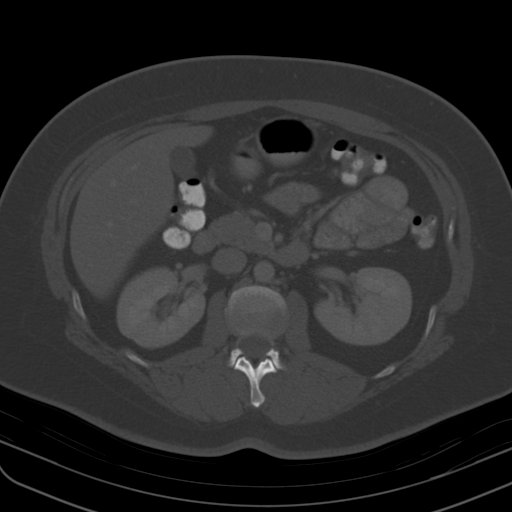
[im 74/105  soft-tissue]
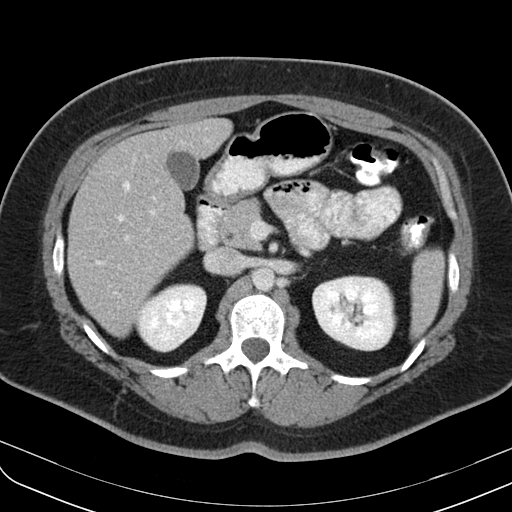
[im 83/105  soft-tissue]
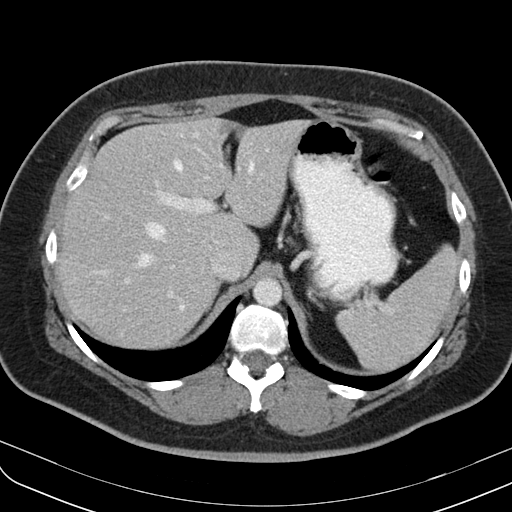
[im 92/105  soft-tissue]
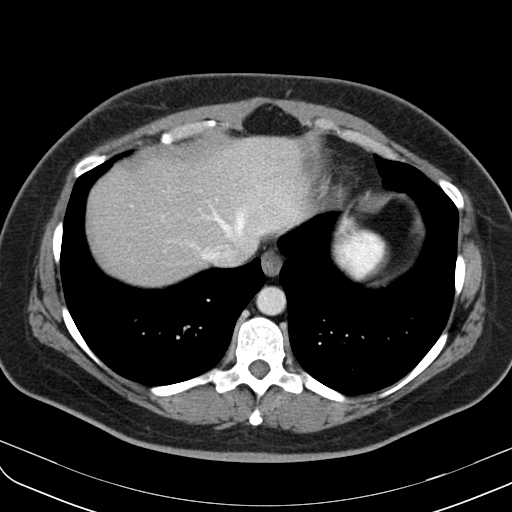
[im 100/105  soft-tissue]
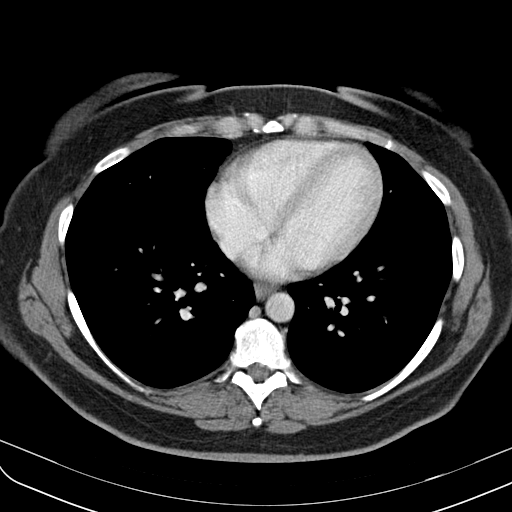

[Series 602: coronal · coronal · 1.02mm/px · 3 of 93 slices shown]
[im 31/93  soft-tissue]
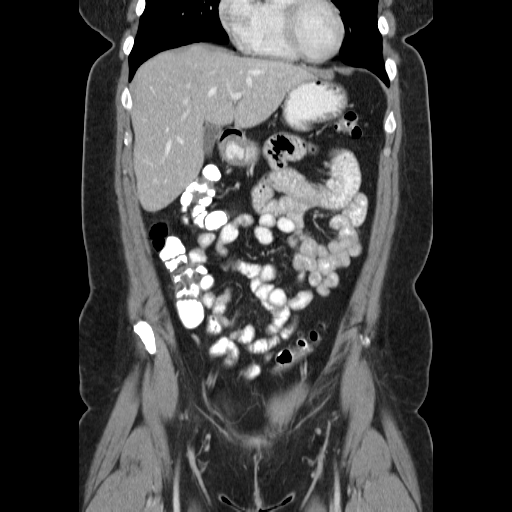
[im 41/93  soft-tissue]
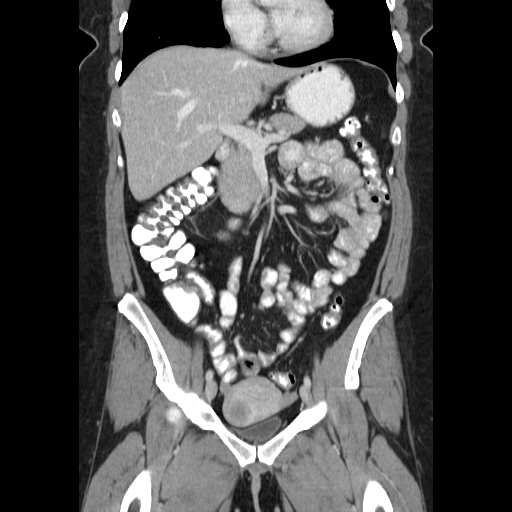
[im 52/93  soft-tissue]
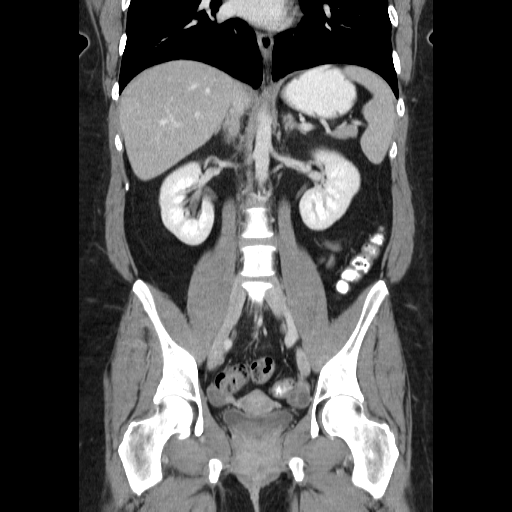

[16 of 46 positions shown; findings below may reference images not displayed]

FINDINGS: Lower chest:  Unremarkable.

Hepatobiliary: No cystic or solid hepatic lesions. No intra or
extrahepatic biliary ductal dilatation. Gallbladder is normal in
appearance.

Pancreas: No pancreatic mass. No pancreatic ductal dilatation. No
pancreatic or peripancreatic fluid or inflammatory changes.

Spleen: Unremarkable.

Adrenals/Urinary Tract: Bilateral adrenal glands and bilateral
kidneys are normal in appearance. No hydroureteronephrosis. Urinary
bladder is normal in appearance. Specifically, no urachal anomaly is
noted.

Stomach/Bowel: The appearance of the stomach is normal. There is no
pathologic dilatation of small bowel or colon. Normal appendix.

Vascular/Lymphatic: No significant atherosclerotic disease, aneurysm
or dissection identified in the abdominal or pelvic vasculature. No
lymphadenopathy noted in the abdomen or pelvis.

Reproductive: Extending inferiorly from the right side of the
uterine fundus there is a 3.2 x 3.1 x 3.2 cm heterogeneously
enhancing lesion, most compatible with a small fibroid. Bilateral
ovaries are unremarkable in appearance.

Other: No significant volume of ascites.  No pneumoperitoneum.

Musculoskeletal: There are no aggressive appearing lytic or blastic
lesions noted in the visualized portions of the skeleton. Umbilical
area is unremarkable in appearance.
IMPRESSION: 1. No acute findings in the abdomen or pelvis to account for the
patient's symptoms.
2. The umbilical area is grossly unremarkable in appearance.
3. No urachal abnormality or other findings to account for the
recent drainage from the umbilicus.
4. Normal appendix.
5. Small fibroid in the right-sided the uterine fundus.

## 2017-09-05 DIAGNOSIS — Z30431 Encounter for routine checking of intrauterine contraceptive device: Secondary | ICD-10-CM | POA: Diagnosis not present

## 2017-10-07 ENCOUNTER — Encounter: Payer: Self-pay | Admitting: Internal Medicine

## 2017-11-15 DIAGNOSIS — Z801 Family history of malignant neoplasm of trachea, bronchus and lung: Secondary | ICD-10-CM | POA: Diagnosis not present

## 2017-11-15 DIAGNOSIS — Z8481 Family history of carrier of genetic disease: Secondary | ICD-10-CM | POA: Diagnosis not present

## 2017-11-15 DIAGNOSIS — Z8041 Family history of malignant neoplasm of ovary: Secondary | ICD-10-CM | POA: Diagnosis not present

## 2017-11-15 DIAGNOSIS — Z800 Family history of malignant neoplasm of digestive organs: Secondary | ICD-10-CM | POA: Diagnosis not present

## 2017-11-15 DIAGNOSIS — Z803 Family history of malignant neoplasm of breast: Secondary | ICD-10-CM | POA: Diagnosis not present

## 2017-11-15 DIAGNOSIS — Z129 Encounter for screening for malignant neoplasm, site unspecified: Secondary | ICD-10-CM | POA: Diagnosis not present

## 2017-11-17 DIAGNOSIS — Z8481 Family history of carrier of genetic disease: Secondary | ICD-10-CM | POA: Insufficient documentation

## 2017-12-06 DIAGNOSIS — Z6833 Body mass index (BMI) 33.0-33.9, adult: Secondary | ICD-10-CM | POA: Diagnosis not present

## 2017-12-11 ENCOUNTER — Other Ambulatory Visit: Payer: Self-pay | Admitting: Internal Medicine

## 2017-12-11 DIAGNOSIS — E039 Hypothyroidism, unspecified: Secondary | ICD-10-CM

## 2018-01-02 ENCOUNTER — Encounter: Payer: Self-pay | Admitting: Internal Medicine

## 2018-01-02 ENCOUNTER — Ambulatory Visit: Payer: BLUE CROSS/BLUE SHIELD | Admitting: Internal Medicine

## 2018-01-02 VITALS — BP 118/68 | HR 98 | Ht 70.0 in | Wt 226.0 lb

## 2018-01-02 DIAGNOSIS — Z6832 Body mass index (BMI) 32.0-32.9, adult: Secondary | ICD-10-CM

## 2018-01-02 DIAGNOSIS — E039 Hypothyroidism, unspecified: Secondary | ICD-10-CM

## 2018-01-02 NOTE — Progress Notes (Signed)
Date:  01/02/2018   Name:  Alicia Horn   DOB:  Aug 28, 1980   MRN:  [id number]   Chief Complaint: Hypothyroidism  Thyroid Problem  Presents for follow-up visit. Patient reports no constipation, depressed mood, diarrhea, fatigue, hoarse voice, leg swelling, menstrual problem, palpitations or weight gain. The symptoms have been stable.   She has been struggling with weight loss - did really well for several years but can not lose any more with ongoing diet and exercise.  She started going to the Bariatric clinic in [place] and is taking Phentermine.  So far she has only lost about 4 lbs.  Review of Systems  Constitutional: Negative for chills, fatigue, fever, unexpected weight change and weight gain.  HENT: Negative for hoarse voice.   Respiratory: Negative for chest tightness, shortness of breath and wheezing.   Cardiovascular: Negative for chest pain, palpitations and leg swelling.  Gastrointestinal: Negative for constipation and diarrhea.  Genitourinary: Negative for menstrual problem.  Musculoskeletal: Negative for arthralgias and joint swelling.  Neurological: Negative for dizziness and headaches.  Hematological: Negative for adenopathy.  Psychiatric/Behavioral: Negative for dysphoric mood and sleep disturbance.    Patient Active Problem List   Diagnosis Date Noted  . Umbilical hernia without obstruction and without gangrene   . Umbilical granuloma   . Borderline diabetes mellitus 03/02/2015  . Laboratory animal allergy 02/03/2015  . Acquired hypothyroidism 02/03/2015  . Blood glucose elevated 02/03/2015  . Adult BMI 30+ 05/26/2014    Allergies  Allergen Reactions  . Acyclovir And Related   . Amoxicillin Other (See Comments)  . Cefuroxime Axetil Diarrhea and Nausea And Vomiting    Other reaction(s): Nausea    Past Surgical History:  Procedure Laterality Date  . EXPLORATORY LAPAROTOMY WITH ABDOMINAL MASS EXCISION N/A 10/14/2015   excision of umbilical granuloma    . UMBILICAL GRANULOMA EXCISION  10/2015  . WISDOM TOOTH EXTRACTION      Social History   Tobacco Use  . Smoking status: Never Smoker  . Smokeless tobacco: Never Used  Substance Use Topics  . Alcohol use: Yes    Alcohol/week: 2.0 standard drinks    Types: 2 Standard drinks or equivalent per week  . Drug use: Not on file     Medication list has been reviewed and updated.  Current Meds  Medication Sig  . levothyroxine (SYNTHROID, LEVOTHROID) 125 MCG tablet TAKE 1 TABLET BY MOUTH DAILY  . PARAGARD INTRAUTERINE COPPER IUD IUD by Intrauterine route. Dr Ward- 07/2017  . phentermine 30 MG capsule Take 30 mg by mouth every morning. [facility]    [facility] 2/9 Scores 01/02/2018 12/01/2016 09/21/2015  PHQ - 2 Score 0 0 0    Physical Exam  Constitutional: She is oriented to person, place, and time. She appears well-developed. No distress.  HENT:  Head: Normocephalic and atraumatic.  Neck: Normal range of motion. Neck supple. No thyromegaly present.  Cardiovascular: Normal rate, regular rhythm and normal heart sounds.  Pulmonary/Chest: Effort normal and breath sounds normal. No respiratory distress.  Musculoskeletal: Normal range of motion. She exhibits no edema or tenderness.  Lymphadenopathy:    She has no cervical adenopathy.  Neurological: She is alert and oriented to person, place, and time.  Skin: Skin is warm and dry. No rash noted.  Psychiatric: She has a normal mood and affect. Her behavior is normal. Thought content normal.  Nursing note and vitals reviewed.   BP 118/68 (BP Location: Right Arm, Patient Position: Sitting, Cuff  Size: Normal)   Pulse 98   Ht 5\' 10"  (1.778 m)   Wt 226 lb (102.5 kg)   LMP 12/15/2017 (Exact Date)   SpO2 97%   BMI 32.43 kg/m   Assessment and Plan: 1. Acquired hypothyroidism Continue current dose - will advise - TSH - Comprehensive metabolic panel  2. BMI 32.0-32.9,adult Continue efforts with diet and  exercise  Partially dictated using Editor, commissioning. Any errors are unintentional.  Halina Maidens, MD [facility]  01/02/2018

## 2018-01-03 DIAGNOSIS — E6609 Other obesity due to excess calories: Secondary | ICD-10-CM | POA: Diagnosis not present

## 2018-01-03 DIAGNOSIS — Z6832 Body mass index (BMI) 32.0-32.9, adult: Secondary | ICD-10-CM | POA: Diagnosis not present

## 2018-01-03 LAB — TSH: TSH: 2.98 u[IU]/mL (ref 0.450–4.500)

## 2018-01-03 LAB — COMPREHENSIVE METABOLIC PANEL
ALK PHOS: 59 IU/L (ref 39–117)
ALT: 18 IU/L (ref 0–32)
AST: 22 IU/L (ref 0–40)
Albumin/Globulin Ratio: 1.8 (ref 1.2–2.2)
Albumin: 5.1 g/dL (ref 3.5–5.5)
BUN/Creatinine Ratio: 12 (ref 9–23)
BUN: 8 mg/dL (ref 6–20)
Bilirubin Total: 0.2 mg/dL (ref 0.0–1.2)
CHLORIDE: 100 mmol/L (ref 96–106)
CO2: 22 mmol/L (ref 20–29)
Calcium: 9.5 mg/dL (ref 8.7–10.2)
Creatinine, Ser: 0.69 mg/dL (ref 0.57–1.00)
GFR calc Af Amer: 127 mL/min/{1.73_m2} (ref >59)
GFR calc non Af Amer: 110 mL/min/{1.73_m2} (ref >59)
GLUCOSE: 88 mg/dL (ref 65–99)
Globulin, Total: 2.8 g/dL (ref 1.5–4.5)
Potassium: 4.2 mmol/L (ref 3.5–5.2)
Sodium: 139 mmol/L (ref 134–144)
Total Protein: 7.9 g/dL (ref 6.0–8.5)

## 2018-03-03 ENCOUNTER — Other Ambulatory Visit: Payer: Self-pay | Admitting: Internal Medicine

## 2018-03-03 DIAGNOSIS — E039 Hypothyroidism, unspecified: Secondary | ICD-10-CM

## 2018-08-14 ENCOUNTER — Other Ambulatory Visit: Payer: Self-pay | Admitting: Internal Medicine

## 2018-08-14 DIAGNOSIS — E039 Hypothyroidism, unspecified: Secondary | ICD-10-CM

## 2018-11-04 DIAGNOSIS — R87612 Low grade squamous intraepithelial lesion on cytologic smear of cervix (LGSIL): Secondary | ICD-10-CM | POA: Diagnosis not present

## 2018-11-04 DIAGNOSIS — Z01419 Encounter for gynecological examination (general) (routine) without abnormal findings: Secondary | ICD-10-CM | POA: Diagnosis not present

## 2018-11-04 DIAGNOSIS — R7303 Prediabetes: Secondary | ICD-10-CM | POA: Diagnosis not present

## 2018-11-04 DIAGNOSIS — Z13220 Encounter for screening for lipoid disorders: Secondary | ICD-10-CM | POA: Diagnosis not present

## 2018-11-04 DIAGNOSIS — Z8742 Personal history of other diseases of the female genital tract: Secondary | ICD-10-CM | POA: Diagnosis not present

## 2018-11-04 DIAGNOSIS — Z1239 Encounter for other screening for malignant neoplasm of breast: Secondary | ICD-10-CM | POA: Diagnosis not present

## 2018-11-04 DIAGNOSIS — R5383 Other fatigue: Secondary | ICD-10-CM | POA: Diagnosis not present

## 2018-11-04 DIAGNOSIS — Z0000 Encounter for general adult medical examination without abnormal findings: Secondary | ICD-10-CM | POA: Diagnosis not present

## 2018-11-04 DIAGNOSIS — R87610 Atypical squamous cells of undetermined significance on cytologic smear of cervix (ASC-US): Secondary | ICD-10-CM | POA: Diagnosis not present

## 2018-11-04 LAB — HM PAP SMEAR: HM Pap smear: NORMAL

## 2018-11-04 LAB — HEMOGLOBIN A1C: Hemoglobin A1C: 5.7

## 2018-11-04 LAB — TSH: TSH: 2.43 (ref 0.41–5.90)

## 2018-11-04 LAB — VITAMIN B12: Vitamin B-12: 406

## 2018-11-05 ENCOUNTER — Other Ambulatory Visit: Payer: Self-pay | Admitting: Obstetrics & Gynecology

## 2018-11-05 DIAGNOSIS — Z1231 Encounter for screening mammogram for malignant neoplasm of breast: Secondary | ICD-10-CM

## 2018-11-06 ENCOUNTER — Ambulatory Visit: Payer: BC Managed Care – PPO | Admitting: Internal Medicine

## 2018-11-06 ENCOUNTER — Other Ambulatory Visit: Payer: Self-pay

## 2018-11-06 ENCOUNTER — Encounter: Payer: Self-pay | Admitting: Internal Medicine

## 2018-11-06 VITALS — BP 117/77 | HR 93 | Resp 16 | Ht 70.0 in | Wt 240.0 lb

## 2018-11-06 DIAGNOSIS — E039 Hypothyroidism, unspecified: Secondary | ICD-10-CM | POA: Diagnosis not present

## 2018-11-06 DIAGNOSIS — R7303 Prediabetes: Secondary | ICD-10-CM

## 2018-11-06 NOTE — Progress Notes (Signed)
Date:  11/06/2018   Name:  Alicia Horn   DOB:  1980-04-17   MRN:  [id number]   Chief Complaint: Hypothyroidism (Had labs on 11/04/2018 see lab results )  Thyroid Problem Presents for follow-up visit. Patient reports no anxiety, constipation, depressed mood, diaphoresis, hoarse voice, leg swelling, palpitations or weight loss. The symptoms have been stable.  Diabetes She presents for her follow-up diabetic visit. Diabetes type: pre-diabetes. Her disease course has been stable. Pertinent negatives for hypoglycemia include no dizziness, headaches or nervousness/anxiousness. Pertinent negatives for diabetes include no chest pain, no polydipsia, no polyuria and no weight loss. When asked about current treatments, none were reported. Her weight is fluctuating minimally.   Lab Results  Component Value Date   TSH 2.43 11/04/2018   Lab Results  Component Value Date   HGBA1C 5.7 11/04/2018     Review of Systems  Constitutional: Negative for diaphoresis, fever, unexpected weight change and weight loss.  HENT: Negative for hoarse voice.   Eyes: Negative for visual disturbance.  Respiratory: Negative for cough, chest tightness, shortness of breath and wheezing.   Cardiovascular: Negative for chest pain, palpitations and leg swelling.  Gastrointestinal: Negative for constipation.  Endocrine: Negative for polydipsia and polyuria.  Neurological: Negative for dizziness and headaches.  Psychiatric/Behavioral: Negative for dysphoric mood. The patient is not nervous/anxious.     Patient Active Problem List   Diagnosis Date Noted  . Family history of BRCA gene mutation 11/17/2017  . Pap smear abnormality of cervix with LGSIL 06/08/2016  . Umbilical hernia without obstruction and without gangrene   . Umbilical granuloma   . Prediabetes 03/02/2015  . Laboratory animal allergy 02/03/2015  . Acquired hypothyroidism 02/03/2015  . Blood glucose elevated 02/03/2015  . BMI 30.0-30.9,adult  05/26/2014    Allergies  Allergen Reactions  . Acyclovir And Related   . Amoxicillin Other (See Comments)  . Cefuroxime Axetil Diarrhea and Nausea And Vomiting    Other reaction(s): Nausea    Past Surgical History:  Procedure Laterality Date  . EXPLORATORY LAPAROTOMY WITH ABDOMINAL MASS EXCISION N/A 10/14/2015   excision of umbilical granuloma  . UMBILICAL GRANULOMA EXCISION  10/2015  . WISDOM TOOTH EXTRACTION      Social History   Tobacco Use  . Smoking status: Never Smoker  . Smokeless tobacco: Never Used  Substance Use Topics  . Alcohol use: Yes    Alcohol/week: 2.0 standard drinks    Types: 2 Standard drinks or equivalent per week  . Drug use: Not on file     Medication list has been reviewed and updated.  Current Meds  Medication Sig  . levothyroxine (SYNTHROID) 125 MCG tablet TAKE 1 TABLET BY MOUTH DAILY  . PARAGARD INTRAUTERINE COPPER IUD IUD by Intrauterine route. Dr Ward- 07/2017    PHQ 2/9 Scores 11/06/2018 01/02/2018 12/01/2016 09/21/2015  PHQ - 2 Score 0 0 0 0    BP Readings from Last 3 Encounters:  11/06/18 117/77  01/02/18 118/68  12/01/16 102/74    Physical Exam Vitals signs and nursing note reviewed.  Constitutional:      General: She is not in acute distress.    Appearance: Normal appearance. She is well-developed.  HENT:     Head: Normocephalic and atraumatic.  Neck:     Musculoskeletal: Normal range of motion. No muscular tenderness.     Vascular: No carotid bruit.  Cardiovascular:     Rate and Rhythm: Normal rate and regular rhythm.     Pulses: Normal  pulses.     Heart sounds: No murmur.  Pulmonary:     Effort: Pulmonary effort is normal. No respiratory distress.  Musculoskeletal: Normal range of motion.     Right lower leg: No edema.     Left lower leg: No edema.  Lymphadenopathy:     Cervical: No cervical adenopathy.  Skin:    General: Skin is warm and dry.     Capillary Refill: Capillary refill takes less than 2 seconds.      Findings: No rash.  Neurological:     General: No focal deficit present.     Mental Status: She is alert and oriented to person, place, and time.     Deep Tendon Reflexes:     Reflex Scores:      Bicep reflexes are 2+ on the right side and 2+ on the left side.      Patellar reflexes are 2+ on the right side and 2+ on the left side.      Achilles reflexes are 1+ on the right side and 1+ on the left side. Psychiatric:        Behavior: Behavior normal.        Thought Content: Thought content normal.     Wt Readings from Last 3 Encounters:  11/06/18 240 lb (108.9 kg)  01/02/18 226 lb (102.5 kg)  12/01/16 228 lb (103.4 kg)    BP 117/77   Pulse 93   Resp 16   Ht 5' 10"  (1.778 m)   Wt 240 lb (108.9 kg)   SpO2 98%   BMI 34.44 kg/m   Assessment and Plan: 1. Acquired hypothyroidism Supplemented with recently normal TSH Continue current dose without change Since GYN is doing labs, she can consider asking them to refill and follow thyroid  2. Prediabetes Discussed diet and weight loss to avoid progression to DM Diet options noted Pt did not lose weight on Phentermine and had side effects   Partially dictated using Editor, commissioning. Any errors are unintentional.  Halina Maidens, MD [facility]  11/06/2018

## 2018-12-06 DIAGNOSIS — N72 Inflammatory disease of cervix uteri: Secondary | ICD-10-CM | POA: Diagnosis not present

## 2018-12-06 DIAGNOSIS — R87610 Atypical squamous cells of undetermined significance on cytologic smear of cervix (ASC-US): Secondary | ICD-10-CM | POA: Diagnosis not present

## 2018-12-11 ENCOUNTER — Ambulatory Visit
Admission: RE | Admit: 2018-12-11 | Discharge: 2018-12-11 | Disposition: A | Discharge status: Home / Self Care | Payer: BC Managed Care – PPO | Source: Ambulatory Visit | Attending: Obstetrics & Gynecology | Admitting: Obstetrics & Gynecology

## 2018-12-11 DIAGNOSIS — Z1231 Encounter for screening mammogram for malignant neoplasm of breast: Secondary | ICD-10-CM

## 2019-01-24 ENCOUNTER — Other Ambulatory Visit: Payer: Self-pay | Admitting: Internal Medicine

## 2019-01-24 DIAGNOSIS — E039 Hypothyroidism, unspecified: Secondary | ICD-10-CM

## 2019-06-23 ENCOUNTER — Ambulatory Visit: Payer: Self-pay | Attending: Internal Medicine

## 2019-06-23 ENCOUNTER — Other Ambulatory Visit: Payer: Self-pay

## 2019-06-23 DIAGNOSIS — Z23 Encounter for immunization: Secondary | ICD-10-CM

## 2019-06-23 NOTE — Progress Notes (Signed)
   Covid-19 Vaccination Clinic  Name:  Amaranta Lynes    MRN: [id number] DOB: 02/14/1981  06/23/2019  Ms. Duggan was observed post Covid-19 immunization for 15 minutes without incident. She was provided with Vaccine Information Sheet and instruction to access the V-Safe system.   Ms. Eilertson was instructed to call 911 with any severe reactions post vaccine: Marland Kitchen Difficulty breathing  . Swelling of face and throat  . A fast heartbeat  . A bad rash all over body  . Dizziness and weakness   Immunizations Administered    Name Date Dose VIS Date Route   Pfizer COVID-19 Vaccine 06/23/2019  9:01 AM 0.3 mL 04/30/2018 Intramuscular   Manufacturer: [employer]   Lot: [id number]   [place]: [id number]

## 2019-07-15 ENCOUNTER — Ambulatory Visit: Payer: Self-pay | Attending: Internal Medicine

## 2019-07-15 DIAGNOSIS — Z23 Encounter for immunization: Secondary | ICD-10-CM

## 2019-07-15 NOTE — Progress Notes (Signed)
   Covid-19 Vaccination Clinic  Name:  Alicia Horn    MRN: [id number] DOB: [birth year]  07/15/2019  Alicia Horn was observed post Covid-19 immunization for 15 minutes without incident. She was provided with Vaccine Information Sheet and instruction to access the V-Safe system.   Alicia Horn was instructed to call 911 with any severe reactions post vaccine: Marland Kitchen Difficulty breathing  . Swelling of face and throat  . A fast heartbeat  . A bad rash all over body  . Dizziness and weakness   Immunizations Administered    Name Date Dose VIS Date Route   Pfizer COVID-19 Vaccine 07/15/2019  9:21 AM 0.3 mL 04/30/2018 Intramuscular   Manufacturer: [place]   Lot: [id number]   [place]: [id number]

## 2019-10-29 ENCOUNTER — Other Ambulatory Visit: Payer: Self-pay | Admitting: Obstetrics & Gynecology

## 2019-10-29 DIAGNOSIS — Z1231 Encounter for screening mammogram for malignant neoplasm of breast: Secondary | ICD-10-CM

## 2019-11-11 DIAGNOSIS — Z1331 Encounter for screening for depression: Secondary | ICD-10-CM | POA: Diagnosis not present

## 2019-11-11 DIAGNOSIS — Z13220 Encounter for screening for lipoid disorders: Secondary | ICD-10-CM | POA: Diagnosis not present

## 2019-11-11 DIAGNOSIS — Z1151 Encounter for screening for human papillomavirus (HPV): Secondary | ICD-10-CM | POA: Diagnosis not present

## 2019-11-11 DIAGNOSIS — Z01419 Encounter for gynecological examination (general) (routine) without abnormal findings: Secondary | ICD-10-CM | POA: Diagnosis not present

## 2019-11-11 DIAGNOSIS — E559 Vitamin D deficiency, unspecified: Secondary | ICD-10-CM | POA: Diagnosis not present

## 2019-11-11 DIAGNOSIS — Z8742 Personal history of other diseases of the female genital tract: Secondary | ICD-10-CM | POA: Diagnosis not present

## 2019-11-11 DIAGNOSIS — Z0000 Encounter for general adult medical examination without abnormal findings: Secondary | ICD-10-CM | POA: Diagnosis not present

## 2019-11-11 DIAGNOSIS — E538 Deficiency of other specified B group vitamins: Secondary | ICD-10-CM | POA: Diagnosis not present

## 2019-11-11 DIAGNOSIS — R7303 Prediabetes: Secondary | ICD-10-CM | POA: Diagnosis not present

## 2019-11-11 LAB — BASIC METABOLIC PANEL
BUN: 10 (ref 4–21)
Creatinine: 0.7 (ref 0.5–1.1)
Glucose: 107
Potassium: 4.3 (ref 3.4–5.3)
Sodium: 138 (ref 137–147)

## 2019-11-11 LAB — HEPATIC FUNCTION PANEL
ALT: 17 (ref 7–35)
AST: 16 (ref 13–35)
Alkaline Phosphatase: 52 (ref 25–125)
Bilirubin, Total: 0.5

## 2019-11-11 LAB — LIPID PANEL
Cholesterol: 193 (ref 0–200)
HDL: 60 (ref 35–70)
LDL Cholesterol: 117
Triglycerides: 75 (ref 40–160)

## 2019-11-11 LAB — HEMOGLOBIN A1C: Hemoglobin A1C: 5.5

## 2019-11-11 LAB — CBC AND DIFFERENTIAL
HCT: 39 (ref 36–46)
Hemoglobin: 13 (ref 12.0–16.0)
Platelets: 288 (ref 150–399)
WBC: 5.3

## 2019-11-11 LAB — VITAMIN D 25 HYDROXY (VIT D DEFICIENCY, FRACTURES): Vit D, 25-Hydroxy: 18

## 2019-11-11 LAB — TSH: TSH: 1.85 (ref 0.41–5.90)

## 2019-11-11 LAB — HM PAP SMEAR: HM Pap smear: NORMAL

## 2019-11-11 LAB — VITAMIN B12: Vitamin B-12: 275

## 2019-12-09 ENCOUNTER — Encounter: Payer: Self-pay | Admitting: Internal Medicine

## 2019-12-10 ENCOUNTER — Other Ambulatory Visit: Payer: Self-pay

## 2019-12-10 DIAGNOSIS — E039 Hypothyroidism, unspecified: Secondary | ICD-10-CM

## 2019-12-10 MED ORDER — LEVOTHYROXINE SODIUM 125 MCG PO TABS: 125.0000 ug | ORAL_TABLET | Freq: Every day | ORAL | 0 refills | Status: DC

## 2019-12-12 ENCOUNTER — Other Ambulatory Visit: Payer: Self-pay | Admitting: Internal Medicine

## 2019-12-15 ENCOUNTER — Ambulatory Visit: Payer: BC Managed Care – PPO | Admitting: Internal Medicine

## 2019-12-15 ENCOUNTER — Ambulatory Visit
Admission: RE | Admit: 2019-12-15 | Discharge: 2019-12-15 | Disposition: A | Discharge status: Home / Self Care | Payer: BC Managed Care – PPO | Source: Ambulatory Visit | Attending: Obstetrics & Gynecology | Admitting: Obstetrics & Gynecology

## 2019-12-15 ENCOUNTER — Encounter: Payer: Self-pay | Admitting: Internal Medicine

## 2019-12-15 ENCOUNTER — Other Ambulatory Visit: Payer: Self-pay

## 2019-12-15 VITALS — BP 122/84 | HR 98 | Temp 99.0°F | Ht 70.0 in | Wt 229.0 lb

## 2019-12-15 DIAGNOSIS — E538 Deficiency of other specified B group vitamins: Secondary | ICD-10-CM | POA: Diagnosis not present

## 2019-12-15 DIAGNOSIS — Z1231 Encounter for screening mammogram for malignant neoplasm of breast: Secondary | ICD-10-CM

## 2019-12-15 DIAGNOSIS — E559 Vitamin D deficiency, unspecified: Secondary | ICD-10-CM | POA: Diagnosis not present

## 2019-12-15 DIAGNOSIS — E039 Hypothyroidism, unspecified: Secondary | ICD-10-CM | POA: Diagnosis not present

## 2019-12-15 DIAGNOSIS — R87612 Low grade squamous intraepithelial lesion on cytologic smear of cervix (LGSIL): Secondary | ICD-10-CM

## 2019-12-15 NOTE — Progress Notes (Signed)
Date:  12/15/2019   Name:  Alicia Horn   DOB:  November 07, 1980   MRN:  [id number]   Chief Complaint: Hypothyroidism (Follow up. )  Thyroid Problem Presents for follow-up visit. Patient reports no constipation, depressed mood, fatigue, hair loss, leg swelling, palpitations or tremors. The symptoms have been stable (she has been on the same dose for some time and feels well.  Recent TSH done by GYN was normal.).    Lab Results  Component Value Date   CREATININE 0.7 11/11/2019   BUN 10 11/11/2019   NA 138 11/11/2019   K 4.3 11/11/2019   CL 100 01/02/2018   CO2 22 01/02/2018   Lab Results  Component Value Date   CHOL 193 11/11/2019   HDL 60 11/11/2019   LDLCALC 117 11/11/2019   TRIG 75 11/11/2019   CHOLHDL 3.3 03/02/2015   Lab Results  Component Value Date   TSH 1.85 11/11/2019   Lab Results  Component Value Date   HGBA1C 5.5 11/11/2019   Lab Results  Component Value Date   WBC 5.3 11/11/2019   HGB 13.0 11/11/2019   HCT 39 11/11/2019   MCV 87.5 09/30/2015   PLT 288 11/11/2019   Lab Results  Component Value Date   ALT 17 11/11/2019   AST 16 11/11/2019   ALKPHOS 52 11/11/2019   BILITOT 0.2 01/02/2018     Review of Systems  Constitutional: Negative for chills, fatigue, fever and unexpected weight change (losing weight with intermittent fasting and low carb).  Respiratory: Negative for cough, chest tightness, shortness of breath and wheezing.   Cardiovascular: Negative for chest pain, palpitations and leg swelling.  Gastrointestinal: Negative for constipation.  Neurological: Negative for dizziness, tremors and headaches.  Psychiatric/Behavioral: Negative for dysphoric mood and sleep disturbance.    Patient Active Problem List   Diagnosis Date Noted  . Family history of BRCA gene mutation 11/17/2017  . Pap smear abnormality of cervix with LGSIL 06/08/2016  . Umbilical hernia without obstruction and without gangrene   . Umbilical granuloma   .  Prediabetes 03/02/2015  . Laboratory animal allergy 02/03/2015  . Acquired hypothyroidism 02/03/2015  . Blood glucose elevated 02/03/2015  . BMI 30.0-30.9,adult 05/26/2014    Allergies  Allergen Reactions  . Acyclovir And Related   . Amoxicillin Other (See Comments)  . Cefuroxime Axetil Diarrhea and Nausea And Vomiting    Other reaction(s): Nausea    Past Surgical History:  Procedure Laterality Date  . EXPLORATORY LAPAROTOMY WITH ABDOMINAL MASS EXCISION N/A 10/14/2015   excision of umbilical granuloma  . UMBILICAL GRANULOMA EXCISION  10/2015  . Alicia TOOTH EXTRACTION      Social History   Tobacco Use  . Smoking status: Never Smoker  . Smokeless tobacco: Never Used  Substance Use Topics  . Alcohol use: Yes    Alcohol/week: 2.0 standard drinks    Types: 2 Standard drinks or equivalent per week  . Drug use: Not on file     Medication list has been reviewed and updated.  Current Meds  Medication Sig  . B-D 3CC LUER-LOK SYR 25GX5/8" 25G X 5/8" 3 ML MISC USE AS DIRECTED QITH BEFORE-12 VACCINE  . cyanocobalamin (,VITAMIN B-12,) 1000 MCG/ML injection Inject 1,000 mcg into the muscle every 30 (thirty) days.  Marland Kitchen levothyroxine (SYNTHROID) 125 MCG tablet Take 1 tablet (125 mcg total) by mouth daily.  Marland Kitchen PARAGARD INTRAUTERINE COPPER IUD IUD by Intrauterine route. Dr Ward- 07/2017  . Vitamin D, Ergocalciferol, (DRISDOL) 1.25 MG (  50000 UNIT) CAPS capsule Take 50,000 Units by mouth once a week.    PHQ 2/9 Scores 12/15/2019 11/06/2018 01/02/2018 12/01/2016  PHQ - 2 Score 0 0 0 0  PHQ- 9 Score 0 - - -    GAD 7 : Generalized Anxiety Score 12/15/2019  Nervous, Anxious, on Edge 0  Control/stop worrying 0  Worry too much - different things 0  Trouble relaxing 0  Restless 0  Easily annoyed or irritable 0  Afraid - awful might happen 0  Total GAD 7 Score 0  Anxiety Difficulty Not difficult at all    BP Readings from Last 3 Encounters:  12/15/19 122/84  11/06/18 117/77  01/02/18  118/68    Physical Exam Vitals and nursing note reviewed.  Constitutional:      General: She is not in acute distress.    Appearance: Normal appearance. She is well-developed.  HENT:     Head: Normocephalic and atraumatic.  Neck:     Thyroid: No thyroid mass or thyroid tenderness.  Cardiovascular:     Rate and Rhythm: Normal rate and regular rhythm.     Pulses: Normal pulses.     Heart sounds: No murmur heard.   Pulmonary:     Effort: Pulmonary effort is normal. No respiratory distress.  Musculoskeletal:        General: Normal range of motion.     Cervical back: Normal range of motion.  Lymphadenopathy:     Cervical: No cervical adenopathy.  Skin:    General: Skin is warm and dry.     Findings: No rash.  Neurological:     Mental Status: She is alert and oriented to person, place, and time.  Psychiatric:        Attention and Perception: Attention normal.     Wt Readings from Last 3 Encounters:  12/15/19 229 lb (103.9 kg)  11/06/18 240 lb (108.9 kg)  01/02/18 226 lb (102.5 kg)    BP 122/84   Pulse 98   Temp 99 F (37.2 C) (Oral)   Ht _0  (1.778 m)   Wt 229 lb (103.9 kg)   SpO2 95%   BMI 32.86 kg/m   Assessment and Plan: 1. Acquired hypothyroidism Supplemented Recent TSH normal No hypothyroid symptoms noted  2. Pap smear abnormality of cervix with LGSIL Followed by GYN   Partially dictated using [employer]. Any errors are unintentional.  Halina Maidens, MD [facility]  12/15/2019

## 2020-01-01 DIAGNOSIS — R87618 Other abnormal cytological findings on specimens from cervix uteri: Secondary | ICD-10-CM | POA: Diagnosis not present

## 2020-02-11 ENCOUNTER — Encounter: Payer: Self-pay | Admitting: Internal Medicine

## 2020-02-11 ENCOUNTER — Other Ambulatory Visit: Payer: Self-pay | Admitting: Internal Medicine

## 2020-02-11 DIAGNOSIS — E039 Hypothyroidism, unspecified: Secondary | ICD-10-CM

## 2020-02-11 MED ORDER — LEVOTHYROXINE SODIUM 125 MCG PO TABS: 125.0000 ug | ORAL_TABLET | Freq: Every day | ORAL | 0 refills | Status: DC

## 2020-03-30 DIAGNOSIS — J0100 Acute maxillary sinusitis, unspecified: Secondary | ICD-10-CM | POA: Diagnosis not present

## 2020-05-17 DIAGNOSIS — E559 Vitamin D deficiency, unspecified: Secondary | ICD-10-CM | POA: Diagnosis not present

## 2020-05-17 DIAGNOSIS — E538 Deficiency of other specified B group vitamins: Secondary | ICD-10-CM | POA: Diagnosis not present

## 2020-06-23 ENCOUNTER — Other Ambulatory Visit: Payer: Self-pay

## 2020-06-23 ENCOUNTER — Encounter: Payer: Self-pay | Admitting: Internal Medicine

## 2020-06-23 DIAGNOSIS — E039 Hypothyroidism, unspecified: Secondary | ICD-10-CM

## 2020-06-23 MED ORDER — LEVOTHYROXINE SODIUM 125 MCG PO TABS: 125.0000 ug | ORAL_TABLET | Freq: Every day | ORAL | 0 refills | Status: DC

## 2020-06-23 NOTE — Telephone Encounter (Signed)
Please advise.  Last AVS on 12/15/2019 states for patient to return 06/14/2020 for a 6 month follow-up.

## 2020-06-23 NOTE — Progress Notes (Unsigned)
Sent in levo to [facility] and sent message back to pt to schedule appt for this month or May

## 2020-06-24 NOTE — Telephone Encounter (Signed)
Noted  For your information   

## 2020-06-24 NOTE — Telephone Encounter (Signed)
Please schedule follow-up for this month or in 6 months.  Up to patient per Dr. Army Melia.

## 2020-08-30 ENCOUNTER — Other Ambulatory Visit: Payer: Self-pay | Admitting: Internal Medicine

## 2020-08-30 DIAGNOSIS — E039 Hypothyroidism, unspecified: Secondary | ICD-10-CM

## 2020-08-30 NOTE — Telephone Encounter (Signed)
Pt. Has appointment in October.

## 2020-11-04 DIAGNOSIS — H5203 Hypermetropia, bilateral: Secondary | ICD-10-CM | POA: Diagnosis not present

## 2020-11-04 DIAGNOSIS — H5213 Myopia, bilateral: Secondary | ICD-10-CM | POA: Diagnosis not present

## 2020-11-11 DIAGNOSIS — Z130 Encounter for screening for diseases of the blood and blood-forming organs and certain disorders involving the immune mechanism: Secondary | ICD-10-CM | POA: Diagnosis not present

## 2020-11-11 DIAGNOSIS — Z01419 Encounter for gynecological examination (general) (routine) without abnormal findings: Secondary | ICD-10-CM | POA: Diagnosis not present

## 2020-11-11 DIAGNOSIS — Z113 Encounter for screening for infections with a predominantly sexual mode of transmission: Secondary | ICD-10-CM | POA: Diagnosis not present

## 2020-11-11 DIAGNOSIS — E559 Vitamin D deficiency, unspecified: Secondary | ICD-10-CM | POA: Diagnosis not present

## 2020-11-11 DIAGNOSIS — Z131 Encounter for screening for diabetes mellitus: Secondary | ICD-10-CM | POA: Diagnosis not present

## 2020-11-11 DIAGNOSIS — Z124 Encounter for screening for malignant neoplasm of cervix: Secondary | ICD-10-CM | POA: Diagnosis not present

## 2020-11-11 DIAGNOSIS — Z13220 Encounter for screening for lipoid disorders: Secondary | ICD-10-CM | POA: Diagnosis not present

## 2020-11-11 DIAGNOSIS — N923 Ovulation bleeding: Secondary | ICD-10-CM | POA: Diagnosis not present

## 2020-11-11 DIAGNOSIS — Z1321 Encounter for screening for nutritional disorder: Secondary | ICD-10-CM | POA: Diagnosis not present

## 2020-11-11 DIAGNOSIS — Z1329 Encounter for screening for other suspected endocrine disorder: Secondary | ICD-10-CM | POA: Diagnosis not present

## 2020-11-11 LAB — VITAMIN B12: Vitamin B-12: 511

## 2020-11-11 LAB — LIPID PANEL
Cholesterol: 201 — AB (ref 0–200)
HDL: 55 (ref 35–70)
LDL Cholesterol: 109
Triglycerides: 184 — AB (ref 40–160)

## 2020-11-11 LAB — CBC AND DIFFERENTIAL
HCT: 39 (ref 36–46)
Hemoglobin: 13.2 (ref 12.0–16.0)
Platelets: 323 10*3/uL (ref 150–400)
WBC: 5.7

## 2020-11-11 LAB — HEMOGLOBIN A1C: Hemoglobin A1C: 5.7

## 2020-11-11 LAB — TSH
TSH: 1.27 (ref 0.41–5.90)
TSH: 1.27 (ref 0.41–5.90)

## 2020-11-12 ENCOUNTER — Other Ambulatory Visit: Payer: Self-pay | Admitting: Certified Nurse Midwife

## 2020-11-12 DIAGNOSIS — Z1231 Encounter for screening mammogram for malignant neoplasm of breast: Secondary | ICD-10-CM

## 2020-12-21 ENCOUNTER — Ambulatory Visit: Payer: BC Managed Care – PPO | Admitting: Internal Medicine

## 2020-12-21 ENCOUNTER — Encounter: Payer: Self-pay | Admitting: Internal Medicine

## 2020-12-21 ENCOUNTER — Ambulatory Visit
Admission: RE | Admit: 2020-12-21 | Discharge: 2020-12-21 | Disposition: A | Discharge status: Home / Self Care | Payer: BC Managed Care – PPO | Source: Ambulatory Visit | Attending: Certified Nurse Midwife | Admitting: Certified Nurse Midwife

## 2020-12-21 ENCOUNTER — Other Ambulatory Visit: Payer: Self-pay

## 2020-12-21 VITALS — BP 136/88 | HR 86 | Ht 70.0 in | Wt 239.6 lb

## 2020-12-21 DIAGNOSIS — E538 Deficiency of other specified B group vitamins: Secondary | ICD-10-CM | POA: Diagnosis not present

## 2020-12-21 DIAGNOSIS — E039 Hypothyroidism, unspecified: Secondary | ICD-10-CM | POA: Diagnosis not present

## 2020-12-21 DIAGNOSIS — Z1231 Encounter for screening mammogram for malignant neoplasm of breast: Secondary | ICD-10-CM | POA: Insufficient documentation

## 2020-12-21 DIAGNOSIS — E559 Vitamin D deficiency, unspecified: Secondary | ICD-10-CM

## 2020-12-21 MED ORDER — LEVOTHYROXINE SODIUM 125 MCG PO TABS: ORAL_TABLET | ORAL | 3 refills | Status: DC

## 2020-12-21 NOTE — Patient Instructions (Signed)
Vitamin D  1000 IU daily  B12 25 mcg? daily

## 2020-12-21 NOTE — Progress Notes (Signed)
Date:  12/21/2020   Name:  Alicia Horn   DOB:  [birth year]   MRN:  [id number]   Chief Complaint: Hypothyroidism  Hypothyroidism: Pt present today to follow up on hypothyroidism. Patient has a TSH check 1 month ago on 11/11/2020 and the results were in normal range at 1.27. No symptoms at this time of visit.   Lab Results  Component Value Date   CREATININE 0.7 11/11/2019   BUN 10 11/11/2019   NA 138 11/11/2019   K 4.3 11/11/2019   CL 100 01/02/2018   CO2 22 01/02/2018   Lab Results  Component Value Date   CHOL 193 11/11/2019   HDL 60 11/11/2019   LDLCALC 117 11/11/2019   TRIG 75 11/11/2019   CHOLHDL 3.3 03/02/2015   Lab Results  Component Value Date   TSH 1.27 11/11/2020   Lab Results  Component Value Date   HGBA1C 5.5 11/11/2019   Lab Results  Component Value Date   WBC 5.3 11/11/2019   HGB 13.0 11/11/2019   HCT 39 11/11/2019   MCV 87.5 09/30/2015   PLT 288 11/11/2019   Lab Results  Component Value Date   ALT 17 11/11/2019   AST 16 11/11/2019   ALKPHOS 52 11/11/2019   BILITOT 0.2 01/02/2018     Review of Systems  Constitutional:  Negative for fatigue and unexpected weight change.  HENT:  Negative for nosebleeds.   Eyes:  Negative for visual disturbance.  Respiratory:  Negative for cough, chest tightness, shortness of breath and wheezing.   Cardiovascular:  Negative for chest pain, palpitations and leg swelling.  Gastrointestinal:  Negative for abdominal pain, constipation and diarrhea.  Neurological:  Negative for dizziness, weakness, light-headedness and headaches.   Patient Active Problem List   Diagnosis Date Noted  . Vitamin D deficiency 12/15/2019  . B12 nutritional deficiency 12/15/2019  . Family history of BRCA gene mutation 11/17/2017  . Pap smear abnormality of cervix with LGSIL 06/08/2016  . Umbilical hernia without obstruction and without gangrene   . Acquired hypothyroidism 02/03/2015  . BMI 30.0-30.9,adult 05/26/2014     Allergies  Allergen Reactions  . Acyclovir And Related   . Amoxicillin Other (See Comments)  . Cefuroxime Axetil Diarrhea and Nausea And Vomiting    Other reaction(s): Nausea    Past Surgical History:  Procedure Laterality Date  . EXPLORATORY LAPAROTOMY WITH ABDOMINAL MASS EXCISION N/A 10/14/2015   excision of umbilical granuloma  . UMBILICAL GRANULOMA EXCISION  10/2015  . WISDOM TOOTH EXTRACTION      Social History   Tobacco Use  . Smoking status: Never  . Smokeless tobacco: Never  Substance Use Topics  . Alcohol use: Yes    Alcohol/week: 2.0 standard drinks    Types: 2 Standard drinks or equivalent per week     Medication list has been reviewed and updated.  Current Meds  Medication Sig  . B-D 3CC LUER-LOK SYR 25GX5/8" 25G X 5/8" 3 ML MISC USE AS DIRECTED QITH BEFORE-12 VACCINE  . levothyroxine (SYNTHROID) 125 MCG tablet TAKE 1 TABLET DAILY (NEED APPOINTMENT FOR FURTHER REFILLS)  . PARAGARD INTRAUTERINE COPPER IUD IUD by Intrauterine route. Dr Ward- 07/2017  . [DISCONTINUED] Vitamin D, Ergocalciferol, (DRISDOL) 1.25 MG (50000 UNIT) CAPS capsule Take 50,000 Units by mouth once a week.    PHQ 2/9 Scores 12/21/2020 12/15/2019 11/06/2018 01/02/2018  PHQ - 2 Score 0 0 0 0  PHQ- 9 Score 0 0 - -    GAD 7 : Generalized  Anxiety Score 12/21/2020 12/15/2019  Nervous, Anxious, on Edge 0 0  Control/stop worrying 0 0  Worry too much - different things 0 0  Trouble relaxing 0 0  Restless 0 0  Easily annoyed or irritable 0 0  Afraid - awful might happen 0 0  Total GAD 7 Score 0 0  Anxiety Difficulty Not difficult at all Not difficult at all    BP Readings from Last 3 Encounters:  12/21/20 136/88  12/15/19 122/84  11/06/18 117/77    Physical Exam Vitals and nursing note reviewed.  Constitutional:      General: She is not in acute distress.    Appearance: She is well-developed.  HENT:     Head: Normocephalic and atraumatic.  Neck:     Vascular: No carotid  bruit.  Cardiovascular:     Rate and Rhythm: Normal rate and regular rhythm.     Pulses: Normal pulses.     Heart sounds: No murmur heard. Pulmonary:     Effort: Pulmonary effort is normal. No respiratory distress.     Breath sounds: No wheezing or rhonchi.  Musculoskeletal:     Cervical back: Normal range of motion. No tenderness.     Right lower leg: No edema.     Left lower leg: No edema.  Lymphadenopathy:     Cervical: No cervical adenopathy.  Skin:    General: Skin is warm and dry.     Findings: No rash.  Neurological:     Mental Status: She is alert and oriented to person, place, and time.  Psychiatric:        Mood and Affect: Mood normal.        Behavior: Behavior normal.    Wt Readings from Last 3 Encounters:  12/21/20 239 lb 9.6 oz (108.7 kg)  12/15/19 229 lb (103.9 kg)  11/06/18 240 lb (108.9 kg)    BP 136/88   Pulse 86   Ht _0  (1.778 m)   Wt 239 lb 9.6 oz (108.7 kg)   LMP 12/21/2020 (Exact Date)   SpO2 97%   BMI 34.38 kg/m   Assessment and Plan: 1. Acquired hypothyroidism Supplemented, recent TSH normal. Continue current therapy and recheck yearly. - levothyroxine (SYNTHROID) 125 MCG tablet; TAKE 1 TABLET DAILY (NEED APPOINTMENT FOR FURTHER REFILLS)  Dispense: 90 tablet; Refill: 3  2. Vitamin D deficiency Begin daily 1000 IU vitamin D  3. B12 nutritional deficiency Begin oral supplement daily to maintain normal levels since completing one year of B12 injections.   Partially dictated using Editor, commissioning. Any errors are unintentional.  Halina Maidens, MD [facility]  12/21/2020

## 2020-12-28 ENCOUNTER — Other Ambulatory Visit: Payer: Self-pay | Admitting: Certified Nurse Midwife

## 2020-12-28 DIAGNOSIS — N6489 Other specified disorders of breast: Secondary | ICD-10-CM

## 2020-12-28 DIAGNOSIS — N6310 Unspecified lump in the right breast, unspecified quadrant: Secondary | ICD-10-CM

## 2020-12-28 DIAGNOSIS — R928 Other abnormal and inconclusive findings on diagnostic imaging of breast: Secondary | ICD-10-CM

## 2021-01-06 ENCOUNTER — Ambulatory Visit
Admission: RE | Admit: 2021-01-06 | Discharge: 2021-01-06 | Disposition: A | Discharge status: Home / Self Care | Payer: BC Managed Care – PPO | Source: Ambulatory Visit | Attending: Certified Nurse Midwife | Admitting: Certified Nurse Midwife

## 2021-01-06 ENCOUNTER — Other Ambulatory Visit: Payer: Self-pay

## 2021-01-06 DIAGNOSIS — N6310 Unspecified lump in the right breast, unspecified quadrant: Secondary | ICD-10-CM | POA: Insufficient documentation

## 2021-01-06 DIAGNOSIS — R928 Other abnormal and inconclusive findings on diagnostic imaging of breast: Secondary | ICD-10-CM | POA: Insufficient documentation

## 2021-01-06 DIAGNOSIS — N6489 Other specified disorders of breast: Secondary | ICD-10-CM

## 2021-01-06 DIAGNOSIS — R922 Inconclusive mammogram: Secondary | ICD-10-CM | POA: Diagnosis not present

## 2021-01-10 ENCOUNTER — Other Ambulatory Visit: Payer: Self-pay | Admitting: Certified Nurse Midwife

## 2021-01-10 DIAGNOSIS — N6310 Unspecified lump in the right breast, unspecified quadrant: Secondary | ICD-10-CM

## 2021-03-10 DIAGNOSIS — D259 Leiomyoma of uterus, unspecified: Secondary | ICD-10-CM | POA: Diagnosis not present

## 2021-03-10 DIAGNOSIS — N83202 Unspecified ovarian cyst, left side: Secondary | ICD-10-CM | POA: Diagnosis not present

## 2021-03-10 DIAGNOSIS — N83201 Unspecified ovarian cyst, right side: Secondary | ICD-10-CM | POA: Diagnosis not present

## 2021-03-10 DIAGNOSIS — N923 Ovulation bleeding: Secondary | ICD-10-CM | POA: Diagnosis not present

## 2021-04-08 DIAGNOSIS — N923 Ovulation bleeding: Secondary | ICD-10-CM | POA: Diagnosis not present

## 2021-06-02 DIAGNOSIS — Z30430 Encounter for insertion of intrauterine contraceptive device: Secondary | ICD-10-CM | POA: Diagnosis not present

## 2021-06-02 DIAGNOSIS — Z30433 Encounter for removal and reinsertion of intrauterine contraceptive device: Secondary | ICD-10-CM | POA: Diagnosis not present

## 2021-06-28 ENCOUNTER — Encounter: Payer: Self-pay | Admitting: Internal Medicine

## 2021-07-01 ENCOUNTER — Ambulatory Visit (INDEPENDENT_AMBULATORY_CARE_PROVIDER_SITE_OTHER): Payer: BC Managed Care – PPO | Admitting: Internal Medicine

## 2021-07-01 ENCOUNTER — Encounter: Payer: Self-pay | Admitting: Internal Medicine

## 2021-07-01 VITALS — BP 136/84 | HR 95 | Ht 70.0 in | Wt 245.0 lb

## 2021-07-01 DIAGNOSIS — Z6835 Body mass index (BMI) 35.0-35.9, adult: Secondary | ICD-10-CM

## 2021-07-01 DIAGNOSIS — R799 Abnormal finding of blood chemistry, unspecified: Secondary | ICD-10-CM | POA: Diagnosis not present

## 2021-07-01 DIAGNOSIS — E039 Hypothyroidism, unspecified: Secondary | ICD-10-CM | POA: Diagnosis not present

## 2021-07-01 DIAGNOSIS — R7303 Prediabetes: Secondary | ICD-10-CM | POA: Diagnosis not present

## 2021-07-01 MED ORDER — SEMAGLUTIDE-WEIGHT MANAGEMENT 0.5 MG/0.5ML ~~LOC~~ SOAJ: 0.5000 mg | SUBCUTANEOUS | 0 refills | Status: DC

## 2021-07-01 NOTE — Progress Notes (Signed)
? ? ?Date:  07/01/2021  ? ?Name:  Alicia Horn   DOB:  17-Aug-1980   MRN:  [id number] ? ? ?Chief Complaint: Weight Loss (Has tried phentermine 5 years ago ) ?Obesity - has struggled for years with weight.  Previously did well with diet and exercise.  She tried phentermine in the past.  She plans to get back to more regular exercise.  Diet is fairly healthy at this time. ? ?HPI ? ?Lab Results  ?Component Value Date  ? NA 138 11/11/2019  ? K 4.3 11/11/2019  ? CO2 22 01/02/2018  ? GLUCOSE 88 01/02/2018  ? BUN 10 11/11/2019  ? CREATININE 0.7 11/11/2019  ? CALCIUM 9.5 01/02/2018  ? GFRNONAA 110 01/02/2018  ? ?Lab Results  ?Component Value Date  ? CHOL 201 (A) 11/11/2020  ? HDL 55 11/11/2020  ? [place] 109 11/11/2020  ? TRIG 184 (A) 11/11/2020  ? CHOLHDL 3.3 03/02/2015  ? ?Lab Results  ?Component Value Date  ? TSH 1.27 11/11/2020  ? TSH 1.27 11/11/2020  ? ?Lab Results  ?Component Value Date  ? HGBA1C 5.7 11/11/2020  ? ?Lab Results  ?Component Value Date  ? WBC 5.7 11/11/2020  ? HGB 13.2 11/11/2020  ? HCT 39 11/11/2020  ? MCV 87.5 09/30/2015  ? PLT 323 11/11/2020  ? ?Lab Results  ?Component Value Date  ? ALT 17 11/11/2019  ? AST 16 11/11/2019  ? ALKPHOS 52 11/11/2019  ? BILITOT 0.2 01/02/2018  ? ?Lab Results  ?Component Value Date  ? VD25OH 18 11/11/2019  ?  ? ?Review of Systems  ?Constitutional:  Negative for fatigue and unexpected weight change.  ?HENT:  Negative for nosebleeds.   ?Eyes:  Negative for visual disturbance.  ?Respiratory:  Negative for cough, chest tightness, shortness of breath and wheezing.   ?Cardiovascular:  Negative for chest pain, palpitations and leg swelling.  ?Gastrointestinal:  Negative for abdominal pain, constipation and diarrhea.  ?Neurological:  Negative for dizziness, weakness, light-headedness and headaches.  ? ?Patient Active Problem List  ? Diagnosis Date Noted  ? Vitamin D deficiency 12/15/2019  ? B12 nutritional deficiency 12/15/2019  ? Family history of BRCA gene mutation  11/17/2017  ? Pap smear abnormality of cervix with LGSIL 06/08/2016  ? Umbilical hernia without obstruction and without gangrene   ? Prediabetes 03/02/2015  ? Acquired hypothyroidism 02/03/2015  ? BMI 35.0-35.9,adult 05/26/2014  ? ? ?Allergies  ?Allergen Reactions  ? Acyclovir And Related   ? Amoxicillin Other (See Comments)  ? Cefuroxime Axetil Diarrhea and Nausea And Vomiting  ?  Other reaction(s): Nausea  ? ? ?Past Surgical History:  ?Procedure Laterality Date  ? EXPLORATORY LAPAROTOMY WITH ABDOMINAL MASS EXCISION N/A 10/14/2015  ? excision of umbilical granuloma  ? UMBILICAL GRANULOMA EXCISION  10/2015  ? WISDOM TOOTH EXTRACTION    ? ? ?Social History  ? ?Tobacco Use  ? Smoking status: Never  ? Smokeless tobacco: Never  ?Substance Use Topics  ? Alcohol use: Yes  ?  Alcohol/week: 2.0 standard drinks  ?  Types: 2 Standard drinks or equivalent per week  ? ? ? ?Medication list has been reviewed and updated. ? ?Current Meds  ?Medication Sig  ? Cyanocobalamin (VITAMIN B12 PO) Take by mouth.  ? levonorgestrel (LILETTA) 20.1 MCG/DAY IUD 1 each by Intrauterine route once.  ? levothyroxine (SYNTHROID) 125 MCG tablet TAKE 1 TABLET DAILY (NEED APPOINTMENT FOR FURTHER REFILLS)  ? Semaglutide-Weight Management 0.5 MG/0.5ML SOAJ Inject 0.5 mg into the skin once a week.  ? [  DISCONTINUED] PARAGARD INTRAUTERINE COPPER IUD IUD by Intrauterine route. Dr Ward- 07/2017  ? ? ? ?  07/01/2021  ?  3:29 PM 12/21/2020  ?  9:07 AM 12/15/2019  ? 10:40 AM  ?GAD 7 : Generalized Anxiety Score  ?Nervous, Anxious, on Edge 0 0 0  ?Control/stop worrying 0 0 0  ?Worry too much - different things 0 0 0  ?Trouble relaxing 0 0 0  ?Restless 0 0 0  ?Easily annoyed or irritable 0 0 0  ?Afraid - awful might happen 0 0 0  ?Total GAD 7 Score 0 0 0  ?Anxiety Difficulty  Not difficult at all Not difficult at all  ? ? ? ?  07/01/2021  ?  3:29 PM  ?Depression screen PHQ 2/9  ?Decreased Interest 0  ?Down, Depressed, Hopeless 0  ?PHQ - 2 Score 0  ?Altered sleeping 0   ?Tired, decreased energy 1  ?Change in appetite 1  ?Feeling bad or failure about yourself  0  ?Trouble concentrating 0  ?Moving slowly or fidgety/restless 0  ?Suicidal thoughts 0  ?PHQ-9 Score 2  ?Difficult doing work/chores Not difficult at all  ? ? ?BP Readings from Last 3 Encounters:  ?07/01/21 136/84  ?12/21/20 136/88  ?12/15/19 122/84  ? ? ?Physical Exam ?Vitals and nursing note reviewed.  ?Constitutional:   ?   General: She is not in acute distress. ?   Appearance: Normal appearance. She is well-developed.  ?HENT:  ?   Head: Normocephalic and atraumatic.  ?Neck:  ?   Vascular: No carotid bruit.  ?Cardiovascular:  ?   Rate and Rhythm: Normal rate and regular rhythm.  ?   Pulses: Normal pulses.  ?Pulmonary:  ?   Effort: Pulmonary effort is normal. No respiratory distress.  ?   Breath sounds: No wheezing or rhonchi.  ?Musculoskeletal:  ?   Cervical back: Normal range of motion.  ?   Right lower leg: No edema.  ?   Left lower leg: No edema.  ?Lymphadenopathy:  ?   Cervical: No cervical adenopathy.  ?Skin: ?   General: Skin is warm and dry.  ?   Findings: No rash.  ?Neurological:  ?   Mental Status: She is alert and oriented to person, place, and time.  ?Psychiatric:     ?   Mood and Affect: Mood normal.     ?   Behavior: Behavior normal.  ? ? ?Wt Readings from Last 3 Encounters:  ?07/01/21 245 lb (111.1 kg)  ?12/21/20 239 lb 9.6 oz (108.7 kg)  ?12/15/19 229 lb (103.9 kg)  ? ? ?BP 136/84   Pulse 95   Ht 5' 10"  (1.778 m)   Wt 245 lb (111.1 kg)   SpO2 98%   BMI 35.15 kg/m?  ? ?Assessment and Plan: ?1. BMI 35.0-35.9,adult ?With prediabetes. ?Sample Wegovy 0.25 mg weekly x 4 weeks then follow up here for weight and BP ?Will send in 0.5 for PA ?Continue diet efforts and increase exercise - would like to aim for 15 lbs over the next 4-5 months ?- Comprehensive metabolic panel ?- Semaglutide-Weight Management 0.5 MG/0.5ML SOAJ; Inject 0.5 mg into the skin once a week.  Dispense: 2 mL; Refill: 0 ? ?2. Acquired  hypothyroidism ?supplemented ? ?3. Prediabetes ?Will benefit from diet changes and weight loss ? ? ?Partially dictated using [employer], [employer]. Any errors are unintentional. ? ?Halina Maidens, MD ?[facility] ?[facility] ? ?07/01/2021 ? ? ? ? ? ?

## 2021-07-02 LAB — COMPREHENSIVE METABOLIC PANEL
ALT: 24 IU/L (ref 0–32)
AST: 17 IU/L (ref 0–40)
Albumin/Globulin Ratio: 1.5 (ref 1.2–2.2)
Albumin: 4.5 g/dL (ref 3.8–4.8)
Alkaline Phosphatase: 65 IU/L (ref 44–121)
BUN/Creatinine Ratio: 13 (ref 9–23)
BUN: 9 mg/dL (ref 6–24)
Bilirubin Total: 0.2 mg/dL (ref 0.0–1.2)
CO2: 24 mmol/L (ref 20–29)
Calcium: 9.7 mg/dL (ref 8.7–10.2)
Chloride: 100 mmol/L (ref 96–106)
Creatinine, Ser: 0.7 mg/dL (ref 0.57–1.00)
Globulin, Total: 3 g/dL (ref 1.5–4.5)
Glucose: 86 mg/dL (ref 70–99)
Potassium: 4 mmol/L (ref 3.5–5.2)
Sodium: 139 mmol/L (ref 134–144)
Total Protein: 7.5 g/dL (ref 6.0–8.5)
eGFR: 110 mL/min/{1.73_m2} (ref >59)

## 2021-07-04 ENCOUNTER — Telehealth: Payer: Self-pay

## 2021-07-04 NOTE — Telephone Encounter (Signed)
Completed PA on [email or web address] for [facility] 0.'5mg'$  - 4 pens for 30 days. ? ?(Key: [id number]) ?Rx #: [id number] ? ?Awaiting outcome. ?

## 2021-07-05 NOTE — Telephone Encounter (Signed)
Message from plan: Approved- Effective from 07/04/2021 through 11/06/2021. Please note: This medication is to be titrated up in strength every 4 weeks as tolerated by the member. The quantity limit set of 4 pens per 180 days allows for this titration through all strengths using 4 pens of each strength every 28 days. ?

## 2021-07-07 ENCOUNTER — Ambulatory Visit
Admission: RE | Admit: 2021-07-07 | Discharge: 2021-07-07 | Disposition: A | Discharge status: Home / Self Care | Payer: BC Managed Care – PPO | Source: Ambulatory Visit | Attending: Certified Nurse Midwife | Admitting: Certified Nurse Midwife

## 2021-07-07 DIAGNOSIS — N6310 Unspecified lump in the right breast, unspecified quadrant: Secondary | ICD-10-CM | POA: Diagnosis not present

## 2021-07-07 DIAGNOSIS — R928 Other abnormal and inconclusive findings on diagnostic imaging of breast: Secondary | ICD-10-CM | POA: Diagnosis not present

## 2021-07-07 DIAGNOSIS — N6313 Unspecified lump in the right breast, lower outer quadrant: Secondary | ICD-10-CM | POA: Diagnosis not present

## 2021-07-21 DIAGNOSIS — J0110 Acute frontal sinusitis, unspecified: Secondary | ICD-10-CM | POA: Diagnosis not present

## 2021-07-21 DIAGNOSIS — J0100 Acute maxillary sinusitis, unspecified: Secondary | ICD-10-CM | POA: Diagnosis not present

## 2021-07-21 DIAGNOSIS — J060 Acute laryngopharyngitis: Secondary | ICD-10-CM | POA: Diagnosis not present

## 2021-08-02 ENCOUNTER — Encounter: Payer: Self-pay | Admitting: Internal Medicine

## 2021-08-02 ENCOUNTER — Ambulatory Visit: Payer: BC Managed Care – PPO | Admitting: Internal Medicine

## 2021-08-02 VITALS — BP 122/78 | HR 62 | Ht 70.0 in | Wt 242.0 lb

## 2021-08-02 DIAGNOSIS — Z6835 Body mass index (BMI) 35.0-35.9, adult: Secondary | ICD-10-CM | POA: Diagnosis not present

## 2021-08-02 DIAGNOSIS — R110 Nausea: Secondary | ICD-10-CM

## 2021-08-02 MED ORDER — SEMAGLUTIDE-WEIGHT MANAGEMENT 0.5 MG/0.5ML ~~LOC~~ SOAJ: 0.5000 mg | SUBCUTANEOUS | 1 refills | Status: DC

## 2021-08-02 NOTE — Progress Notes (Signed)
Date:  08/02/2021   Name:  Alicia Horn   DOB:  1980/11/28   MRN:  [id number]   Chief Complaint: Weight Check Weight management - pt is here for weight management follow up.  Started on 07/01/21 on Wegovy 0.25 mg.  Doing well without side effects.  Starting weight 245 lbs.   Today's weight 242 lbs.  Current diet is cutting back on portions, and focusing on protein and fruits, and current exercise routine is only doing a little at this time.  HPI  Lab Results  Component Value Date   NA 139 07/01/2021   K 4.0 07/01/2021   CO2 24 07/01/2021   GLUCOSE 86 07/01/2021   BUN 9 07/01/2021   CREATININE 0.70 07/01/2021   CALCIUM 9.7 07/01/2021   EGFR 110 07/01/2021   GFRNONAA 110 01/02/2018   Lab Results  Component Value Date   CHOL 201 (A) 11/11/2020   HDL 55 11/11/2020   LDLCALC 109 11/11/2020   TRIG 184 (A) 11/11/2020   CHOLHDL 3.3 03/02/2015   Lab Results  Component Value Date   TSH 1.27 11/11/2020   TSH 1.27 11/11/2020   Lab Results  Component Value Date   HGBA1C 5.7 11/11/2020   Lab Results  Component Value Date   WBC 5.7 11/11/2020   HGB 13.2 11/11/2020   HCT 39 11/11/2020   MCV 87.5 09/30/2015   PLT 323 11/11/2020   Lab Results  Component Value Date   ALT 24 07/01/2021   AST 17 07/01/2021   ALKPHOS 65 07/01/2021   BILITOT 0.2 07/01/2021   Lab Results  Component Value Date   VD25OH 18 11/11/2019     Review of Systems  Constitutional:  Negative for appetite change, fatigue, fever and unexpected weight change.  Eyes:  Negative for visual disturbance.  Respiratory:  Negative for chest tightness and shortness of breath.   Cardiovascular:  Negative for chest pain, palpitations and leg swelling.  Gastrointestinal:  Positive for nausea (mild nausea on the first day of the dose). Negative for abdominal pain, diarrhea and vomiting.  Endocrine: Negative for polydipsia and polyuria.  Genitourinary:  Negative for dysuria and hematuria.  Musculoskeletal:   Negative for arthralgias.  Neurological:  Negative for tremors, numbness and headaches.  Psychiatric/Behavioral:  Negative for dysphoric mood and sleep disturbance. The patient is not nervous/anxious.    Patient Active Problem List   Diagnosis Date Noted   Vitamin D deficiency 12/15/2019   B12 nutritional deficiency 12/15/2019   Family history of BRCA gene mutation 11/17/2017   Pap smear abnormality of cervix with LGSIL 25/52/5894   Umbilical hernia without obstruction and without gangrene    Prediabetes 03/02/2015   Acquired hypothyroidism 02/03/2015   BMI 35.0-35.9,adult 05/26/2014    Allergies  Allergen Reactions   Acyclovir And Related    Amoxicillin Other (See Comments)   Cefuroxime Axetil Diarrhea and Nausea And Vomiting    Other reaction(s): Nausea    Past Surgical History:  Procedure Laterality Date   EXPLORATORY LAPAROTOMY WITH ABDOMINAL MASS EXCISION N/A 10/14/2015   excision of umbilical granuloma   UMBILICAL GRANULOMA EXCISION  10/2015   WISDOM TOOTH EXTRACTION      Social History   Tobacco Use   Smoking status: Never   Smokeless tobacco: Never  Substance Use Topics   Alcohol use: Yes    Alcohol/week: 2.0 standard drinks    Types: 2 Standard drinks or equivalent per week     Medication list has been reviewed and updated.  Current Meds  Medication Sig   Cyanocobalamin (VITAMIN B12 PO) Take by mouth.   levonorgestrel (LILETTA) 20.1 MCG/DAY IUD 1 each by Intrauterine route once.   levothyroxine (SYNTHROID) 125 MCG tablet TAKE 1 TABLET DAILY (NEED APPOINTMENT FOR FURTHER REFILLS)   Semaglutide-Weight Management 0.5 MG/0.5ML SOAJ Inject 0.5 mg into the skin once a week.       07/01/2021    3:29 PM 12/21/2020    9:07 AM 12/15/2019   10:40 AM  GAD 7 : Generalized Anxiety Score  Nervous, Anxious, on Edge 0 0 0  Control/stop worrying 0 0 0  Worry too much - different things 0 0 0  Trouble relaxing 0 0 0  Restless 0 0 0  Easily annoyed or irritable 0 0  0  Afraid - awful might happen 0 0 0  Total GAD 7 Score 0 0 0  Anxiety Difficulty  Not difficult at all Not difficult at all       07/01/2021    3:29 PM  Depression screen PHQ 2/9  Decreased Interest 0  Down, Depressed, Hopeless 0  PHQ - 2 Score 0  Altered sleeping 0  Tired, decreased energy 1  Change in appetite 1  Feeling bad or failure about yourself  0  Trouble concentrating 0  Moving slowly or fidgety/restless 0  Suicidal thoughts 0  PHQ-9 Score 2  Difficult doing work/chores Not difficult at all    BP Readings from Last 3 Encounters:  08/02/21 122/78  07/01/21 136/84  12/21/20 136/88    Physical Exam Vitals and nursing note reviewed.  Constitutional:      General: She is not in acute distress.    Appearance: She is well-developed.  HENT:     Head: Normocephalic and atraumatic.  Cardiovascular:     Rate and Rhythm: Normal rate and regular rhythm.     Pulses: Normal pulses.  Pulmonary:     Effort: Pulmonary effort is normal. No respiratory distress.     Breath sounds: No wheezing or rhonchi.  Abdominal:     General: Abdomen is flat.     Palpations: Abdomen is soft. There is no mass.     Tenderness: There is no abdominal tenderness.  Musculoskeletal:     Cervical back: Normal range of motion.     Right lower leg: No edema.     Left lower leg: No edema.  Skin:    General: Skin is warm and dry.     Findings: No rash.  Neurological:     Mental Status: She is alert and oriented to person, place, and time.  Psychiatric:        Mood and Affect: Mood normal.        Behavior: Behavior normal.    Wt Readings from Last 3 Encounters:  08/02/21 242 lb (109.8 kg)  07/01/21 245 lb (111.1 kg)  12/21/20 239 lb 9.6 oz (108.7 kg)    BP 122/78   Pulse 62   Ht 5' 10"  (1.778 m)   Wt 242 lb (109.8 kg)   SpO2 97%   BMI 34.72 kg/m   Assessment and Plan: 1. BMI 35.0-35.9,adult Good initial response to [facility]. Will increase to 0.5 mg weekly for the next 2 months.   If desired, she can call after the next month for an increased dose. Continue diet changes and exercise. - Semaglutide-Weight Management 0.5 MG/0.5ML SOAJ; Inject 0.5 mg into the skin once a week.  Dispense: 2 mL; Refill: 1   Partially dictated using  Editor, commissioning. Any errors are unintentional.  Halina Maidens, MD [facility]  08/02/2021

## 2021-08-04 ENCOUNTER — Other Ambulatory Visit: Payer: Self-pay

## 2021-08-04 ENCOUNTER — Encounter: Payer: Self-pay | Admitting: Internal Medicine

## 2021-08-04 DIAGNOSIS — Z6835 Body mass index (BMI) 35.0-35.9, adult: Secondary | ICD-10-CM

## 2021-08-04 MED ORDER — WEGOVY 0.25 MG/0.5ML ~~LOC~~ SOAJ: 0.2500 mg | SUBCUTANEOUS | 0 refills | Status: DC

## 2021-08-04 MED ORDER — OZEMPIC (0.25 OR 0.5 MG/DOSE) 2 MG/3ML ~~LOC~~ SOPN: 0.2500 mg | PEN_INJECTOR | SUBCUTANEOUS | 0 refills | Status: DC

## 2021-08-05 ENCOUNTER — Telehealth: Payer: Self-pay

## 2021-08-05 ENCOUNTER — Other Ambulatory Visit: Payer: Self-pay

## 2021-08-05 NOTE — Telephone Encounter (Signed)
error 

## 2021-08-30 ENCOUNTER — Other Ambulatory Visit: Payer: Self-pay

## 2021-08-30 DIAGNOSIS — Z6835 Body mass index (BMI) 35.0-35.9, adult: Secondary | ICD-10-CM

## 2021-08-30 MED ORDER — WEGOVY 0.5 MG/0.5ML ~~LOC~~ SOAJ: 0.5000 mg | SUBCUTANEOUS | 0 refills | Status: DC

## 2021-09-08 ENCOUNTER — Other Ambulatory Visit: Payer: Self-pay | Admitting: Internal Medicine

## 2021-09-08 DIAGNOSIS — R062 Wheezing: Secondary | ICD-10-CM | POA: Diagnosis not present

## 2021-09-08 DIAGNOSIS — J209 Acute bronchitis, unspecified: Secondary | ICD-10-CM | POA: Diagnosis not present

## 2021-09-30 ENCOUNTER — Ambulatory Visit: Payer: BC Managed Care – PPO | Admitting: Internal Medicine

## 2021-09-30 DIAGNOSIS — H1011 Acute atopic conjunctivitis, right eye: Secondary | ICD-10-CM | POA: Diagnosis not present

## 2021-12-15 DIAGNOSIS — H5203 Hypermetropia, bilateral: Secondary | ICD-10-CM | POA: Diagnosis not present

## 2021-12-20 DIAGNOSIS — N6310 Unspecified lump in the right breast, unspecified quadrant: Secondary | ICD-10-CM | POA: Diagnosis not present

## 2021-12-20 DIAGNOSIS — Z01419 Encounter for gynecological examination (general) (routine) without abnormal findings: Secondary | ICD-10-CM | POA: Diagnosis not present

## 2021-12-20 DIAGNOSIS — Z1331 Encounter for screening for depression: Secondary | ICD-10-CM | POA: Diagnosis not present

## 2021-12-20 LAB — RESULTS CONSOLE HPV: CHL HPV: NEGATIVE

## 2021-12-20 LAB — HM PAP SMEAR: HM Pap smear: NORMAL

## 2021-12-21 ENCOUNTER — Other Ambulatory Visit: Payer: Self-pay | Admitting: Obstetrics and Gynecology

## 2021-12-21 DIAGNOSIS — N6310 Unspecified lump in the right breast, unspecified quadrant: Secondary | ICD-10-CM

## 2022-01-10 ENCOUNTER — Ambulatory Visit
Admission: RE | Admit: 2022-01-10 | Discharge: 2022-01-10 | Disposition: A | Discharge status: Home / Self Care | Payer: BC Managed Care – PPO | Source: Ambulatory Visit | Attending: Obstetrics and Gynecology | Admitting: Obstetrics and Gynecology

## 2022-01-10 DIAGNOSIS — N6310 Unspecified lump in the right breast, unspecified quadrant: Secondary | ICD-10-CM | POA: Diagnosis not present

## 2022-01-10 DIAGNOSIS — N6313 Unspecified lump in the right breast, lower outer quadrant: Secondary | ICD-10-CM | POA: Diagnosis not present

## 2022-01-10 DIAGNOSIS — R92323 Mammographic fibroglandular density, bilateral breasts: Secondary | ICD-10-CM | POA: Diagnosis not present

## 2022-02-07 ENCOUNTER — Encounter: Payer: Self-pay | Admitting: Internal Medicine

## 2022-02-07 ENCOUNTER — Other Ambulatory Visit: Payer: Self-pay | Admitting: Internal Medicine

## 2022-02-07 ENCOUNTER — Ambulatory Visit: Payer: BC Managed Care – PPO | Admitting: Internal Medicine

## 2022-02-07 VITALS — BP 128/78 | HR 96 | Ht 70.0 in | Wt 251.0 lb

## 2022-02-07 DIAGNOSIS — E039 Hypothyroidism, unspecified: Secondary | ICD-10-CM | POA: Diagnosis not present

## 2022-02-07 NOTE — Progress Notes (Signed)
Date:  02/07/2022   Name:  Alicia Horn   DOB:  [birth year]   MRN:  [id number]   Chief Complaint: No chief complaint on file.  Thyroid Problem Presents for follow-up visit. Patient reports no cold intolerance, constipation, depressed mood, diaphoresis, diarrhea, heat intolerance, hoarse voice, palpitations, tremors, weight gain or weight loss. The symptoms have been stable.    Lab Results  Component Value Date   NA 139 07/01/2021   K 4.0 07/01/2021   CO2 24 07/01/2021   GLUCOSE 86 07/01/2021   BUN 9 07/01/2021   CREATININE 0.70 07/01/2021   CALCIUM 9.7 07/01/2021   EGFR 110 07/01/2021   GFRNONAA 110 01/02/2018   Lab Results  Component Value Date   CHOL 201 (A) 11/11/2020   HDL 55 11/11/2020   LDLCALC 109 11/11/2020   TRIG 184 (A) 11/11/2020   CHOLHDL 3.3 03/02/2015   Lab Results  Component Value Date   TSH 1.27 11/11/2020   TSH 1.27 11/11/2020   Lab Results  Component Value Date   HGBA1C 5.7 11/11/2020   Lab Results  Component Value Date   WBC 5.7 11/11/2020   HGB 13.2 11/11/2020   HCT 39 11/11/2020   MCV 87.5 09/30/2015   PLT 323 11/11/2020   Lab Results  Component Value Date   ALT 24 07/01/2021   AST 17 07/01/2021   ALKPHOS 65 07/01/2021   BILITOT 0.2 07/01/2021   Lab Results  Component Value Date   VD25OH 18 11/11/2019     Review of Systems  Constitutional:  Negative for diaphoresis, weight gain and weight loss.  HENT:  Negative for hoarse voice.   Respiratory:  Negative for chest tightness and shortness of breath.   Cardiovascular:  Negative for palpitations.  Gastrointestinal:  Negative for constipation and diarrhea.  Endocrine: Negative for cold intolerance and heat intolerance.  Neurological:  Negative for tremors.    Patient Active Problem List   Diagnosis Date Noted   Vitamin D deficiency 12/15/2019   B12 nutritional deficiency 12/15/2019   Family history of BRCA gene mutation 11/17/2017   Pap smear abnormality of cervix  with LGSIL 09/47/0962   Umbilical hernia without obstruction and without gangrene    Prediabetes 03/02/2015   Acquired hypothyroidism 02/03/2015   BMI 35.0-35.9,adult 05/26/2014    Allergies  Allergen Reactions   Acyclovir And Related    Amoxicillin Other (See Comments)   Cefuroxime Axetil Diarrhea and Nausea And Vomiting    Other reaction(s): Nausea    Past Surgical History:  Procedure Laterality Date   EXPLORATORY LAPAROTOMY WITH ABDOMINAL MASS EXCISION N/A 10/14/2015   excision of umbilical granuloma   UMBILICAL GRANULOMA EXCISION  10/2015   WISDOM TOOTH EXTRACTION      Social History   Tobacco Use   Smoking status: Never   Smokeless tobacco: Never  Substance Use Topics   Alcohol use: Yes    Alcohol/week: 2.0 standard drinks of alcohol    Types: 2 Standard drinks or equivalent per week     Medication list has been reviewed and updated.  Current Meds  Medication Sig   Cyanocobalamin (VITAMIN B12 PO) Take by mouth.   levonorgestrel (LILETTA) 20.1 MCG/DAY IUD 1 each by Intrauterine route once.   levothyroxine (SYNTHROID) 125 MCG tablet TAKE 1 TABLET DAILY (NEED APPOINTMENT FOR FURTHER REFILLS)       02/07/2022    2:44 PM 08/02/2021    9:33 AM 07/01/2021    3:29 PM 12/21/2020    9:07 AM  GAD 7 : Generalized Anxiety Score  Nervous, Anxious, on Edge 0 0 0 0  Control/stop worrying 0 0 0 0  Worry too much - different things 0 0 0 0  Trouble relaxing 0 0 0 0  Restless 0 0 0 0  Easily annoyed or irritable 0 0 0 0  Afraid - awful might happen 0 0 0 0  Total GAD 7 Score 0 0 0 0  Anxiety Difficulty Not difficult at all Not difficult at all  Not difficult at all       02/07/2022    2:44 PM 08/02/2021    9:32 AM 07/01/2021    3:29 PM  Depression screen PHQ 2/9  Decreased Interest 0 0 0  Down, Depressed, Hopeless 0 0 0  PHQ - 2 Score 0 0 0  Altered sleeping 0 0 0  Tired, decreased energy 0 0 1  Change in appetite 0 0 1  Feeling bad or failure about yourself  0 0  0  Trouble concentrating 0 0 0  Moving slowly or fidgety/restless 0 0 0  Suicidal thoughts 0 0 0  PHQ-9 Score 0 0 2  Difficult doing work/chores Not difficult at all Not difficult at all Not difficult at all    BP Readings from Last 3 Encounters:  02/07/22 128/78  08/02/21 122/78  07/01/21 136/84    Physical Exam Vitals and nursing note reviewed.  Constitutional:      General: She is not in acute distress.    Appearance: Normal appearance. She is well-developed.  HENT:     Head: Normocephalic and atraumatic.  Neck:     Vascular: No carotid bruit.  Cardiovascular:     Rate and Rhythm: Normal rate and regular rhythm.     Heart sounds: No murmur heard. Pulmonary:     Effort: Pulmonary effort is normal. No respiratory distress.     Breath sounds: No wheezing or rhonchi.  Musculoskeletal:     Cervical back: Normal range of motion. No tenderness.     Right lower leg: No edema.     Left lower leg: No edema.  Lymphadenopathy:     Cervical: No cervical adenopathy.  Skin:    General: Skin is warm and dry.     Findings: No rash.  Neurological:     Mental Status: She is alert and oriented to person, place, and time.  Psychiatric:        Mood and Affect: Mood normal.        Behavior: Behavior normal.     Wt Readings from Last 3 Encounters:  02/07/22 251 lb (113.9 kg)  08/02/21 242 lb (109.8 kg)  07/01/21 245 lb (111.1 kg)    BP 128/78   Pulse 96   Ht _0  (1.778 m)   Wt 251 lb (113.9 kg)   LMP 01/08/2022   SpO2 96%   BMI 36.01 kg/m   Assessment and Plan: 1. Acquired hypothyroidism Supplemented - check labs and advise if dose change is needed Will refill medications once labs return - TSH + free T4  Consider CPX for screening labs and HM updates not done by GYN.  Partially dictated using Editor, commissioning. Any errors are unintentional.  Halina Maidens, MD [facility]  02/07/2022

## 2022-02-08 ENCOUNTER — Other Ambulatory Visit: Payer: Self-pay

## 2022-02-08 ENCOUNTER — Other Ambulatory Visit: Payer: Self-pay | Admitting: Internal Medicine

## 2022-02-08 ENCOUNTER — Encounter: Payer: Self-pay | Admitting: Internal Medicine

## 2022-02-08 DIAGNOSIS — E039 Hypothyroidism, unspecified: Secondary | ICD-10-CM

## 2022-02-08 LAB — TSH+FREE T4
Free T4: 1.2 ng/dL (ref 0.82–1.77)
TSH: 4.79 u[IU]/mL — ABNORMAL HIGH (ref 0.450–4.500)

## 2022-02-08 MED ORDER — LEVOTHYROXINE SODIUM 125 MCG PO TABS: ORAL_TABLET | ORAL | 1 refills | Status: DC

## 2022-02-15 DIAGNOSIS — R051 Acute cough: Secondary | ICD-10-CM | POA: Diagnosis not present

## 2022-06-09 ENCOUNTER — Encounter: Payer: Self-pay | Admitting: Internal Medicine

## 2022-06-09 ENCOUNTER — Ambulatory Visit (INDEPENDENT_AMBULATORY_CARE_PROVIDER_SITE_OTHER): Payer: BC Managed Care – PPO | Admitting: Internal Medicine

## 2022-06-09 VITALS — BP 132/88 | HR 96 | Ht 70.0 in | Wt 249.6 lb

## 2022-06-09 DIAGNOSIS — E781 Pure hyperglyceridemia: Secondary | ICD-10-CM | POA: Diagnosis not present

## 2022-06-09 DIAGNOSIS — R7303 Prediabetes: Secondary | ICD-10-CM | POA: Diagnosis not present

## 2022-06-09 DIAGNOSIS — E039 Hypothyroidism, unspecified: Secondary | ICD-10-CM

## 2022-06-09 DIAGNOSIS — E538 Deficiency of other specified B group vitamins: Secondary | ICD-10-CM

## 2022-06-09 DIAGNOSIS — R030 Elevated blood-pressure reading, without diagnosis of hypertension: Secondary | ICD-10-CM

## 2022-06-09 DIAGNOSIS — Z0000 Encounter for general adult medical examination without abnormal findings: Secondary | ICD-10-CM | POA: Diagnosis not present

## 2022-06-09 DIAGNOSIS — E559 Vitamin D deficiency, unspecified: Secondary | ICD-10-CM | POA: Diagnosis not present

## 2022-06-09 DIAGNOSIS — E782 Mixed hyperlipidemia: Secondary | ICD-10-CM | POA: Diagnosis not present

## 2022-06-09 NOTE — Assessment & Plan Note (Signed)
Daily supplement recommended

## 2022-06-09 NOTE — Patient Instructions (Signed)
Monitor BP - goal is 130/80 or less

## 2022-06-09 NOTE — Assessment & Plan Note (Addendum)
Lab Results  Component Value Date   VITAMINB12 511 11/11/2020  Supplemented by injection then discontinued Not on oral at this time

## 2022-06-09 NOTE — Progress Notes (Signed)
Date:  06/09/2022   Name:  Alicia Horn   DOB:  1981/01/20   MRN:  [id number]   Chief Complaint: Annual Exam (Has GYN - Dr [facility]. No breast exam.) Alicia Servemanda Powell Zagami is a 42 y.o. female who presents today for her Complete Annual Exam. She feels well. She reports exercising / walking. She reports she is sleeping well. Breast complaints - none.  Mammogram: 01/2022 DEXA: none Pap smear: 12/2021 neg/neg Colonoscopy: not due  Health Maintenance Due  Topic Date Due   Hepatitis C Screening  Never done   DTaP/Tdap/Td (1 - Tdap) Never done   COVID-19 Vaccine (5 - 2023-24 season) 11/04/2021    Immunization History  Administered Date(s) Administered   PFIZER Comirnaty(Gray Top)Covid-19 Tri-Sucrose Vaccine 06/23/2019, 07/15/2019   PFIZER(Purple Top)SARS-COV-2 Vaccination 06/23/2019, 07/15/2019    Thyroid Problem Presents for follow-up visit. Patient reports no anxiety, constipation, diarrhea, fatigue, palpitations or tremors. The symptoms have been stable.    Lab Results  Component Value Date   NA 139 07/01/2021   K 4.0 07/01/2021   CO2 24 07/01/2021   GLUCOSE 86 07/01/2021   BUN 9 07/01/2021   CREATININE 0.70 07/01/2021   CALCIUM 9.7 07/01/2021   EGFR 110 07/01/2021   GFRNONAA 110 01/02/2018   Lab Results  Component Value Date   CHOL 201 (A) 11/11/2020   HDL 55 11/11/2020   LDLCALC 109 11/11/2020   TRIG 184 (A) 11/11/2020   CHOLHDL 3.3 03/02/2015   Lab Results  Component Value Date   TSH 4.790 (H) 02/07/2022   Lab Results  Component Value Date   HGBA1C 5.7 11/11/2020   Lab Results  Component Value Date   WBC 5.7 11/11/2020   HGB 13.2 11/11/2020   HCT 39 11/11/2020   MCV 87.5 09/30/2015   PLT 323 11/11/2020   Lab Results  Component Value Date   ALT 24 07/01/2021   AST 17 07/01/2021   ALKPHOS 65 07/01/2021   BILITOT 0.2 07/01/2021   Lab Results  Component Value Date   VD25OH 18 11/11/2019     Review of Systems   Constitutional:  Negative for chills, fatigue and fever.  HENT:  Negative for congestion, hearing loss, tinnitus, trouble swallowing and voice change.   Eyes:  Negative for visual disturbance.  Respiratory:  Negative for cough, chest tightness, shortness of breath and wheezing.   Cardiovascular:  Negative for chest pain, palpitations and leg swelling.  Gastrointestinal:  Negative for abdominal pain, constipation, diarrhea and vomiting.  Endocrine: Negative for polydipsia and polyuria.  Genitourinary:  Negative for dysuria, frequency, genital sores, vaginal bleeding and vaginal discharge.  Musculoskeletal:  Negative for arthralgias, gait problem and joint swelling.  Skin:  Negative for color change and rash.  Neurological:  Negative for dizziness, tremors, light-headedness and headaches.  Hematological:  Negative for adenopathy. Does not bruise/bleed easily.  Psychiatric/Behavioral:  Negative for dysphoric mood and sleep disturbance. The patient is not nervous/anxious.     Patient Active Problem List   Diagnosis Date Noted   Vitamin D deficiency 12/15/2019   B12 nutritional deficiency 12/15/2019   Family history of BRCA gene mutation 11/17/2017   Pap smear abnormality of cervix with LGSIL 06/08/2016   Umbilical hernia without obstruction and without gangrene    Prediabetes 03/02/2015   Acquired hypothyroidism 02/03/2015   BMI 35.0-35.9,adult 05/26/2014    Allergies  Allergen Reactions   Acyclovir And Related    Amoxicillin Other (See Comments)   Cefuroxime Axetil Diarrhea and  Nausea And Vomiting    Other reaction(s): Nausea    Past Surgical History:  Procedure Laterality Date   EXPLORATORY LAPAROTOMY WITH ABDOMINAL MASS EXCISION N/A 10/14/2015   excision of umbilical granuloma   UMBILICAL GRANULOMA EXCISION  10/2015   WISDOM TOOTH EXTRACTION      Social History   Tobacco Use   Smoking status: Never   Smokeless tobacco: Never  Substance Use Topics   Alcohol use: Yes     Alcohol/week: 2.0 standard drinks of alcohol    Types: 2 Standard drinks or equivalent per week     Medication list has been reviewed and updated.  Current Meds  Medication Sig   levonorgestrel (LILETTA) 20.1 MCG/DAY IUD 1 each by Intrauterine route once.   levothyroxine (SYNTHROID) 125 MCG tablet TAKE 1 TABLET DAILY (NEED APPOINTMENT FOR FURTHER REFILLS)   [DISCONTINUED] Cyanocobalamin (VITAMIN B12 PO) Take by mouth.       06/09/2022    8:32 AM 02/07/2022    2:44 PM 08/02/2021    9:33 AM 07/01/2021    3:29 PM  GAD 7 : Generalized Anxiety Score  Nervous, Anxious, on Edge 0 0 0 0  Control/stop worrying 0 0 0 0  Worry too much - different things 0 0 0 0  Trouble relaxing 0 0 0 0  Restless 0 0 0 0  Easily annoyed or irritable 0 0 0 0  Afraid - awful might happen 0 0 0 0  Total GAD 7 Score 0 0 0 0  Anxiety Difficulty Not difficult at all Not difficult at all Not difficult at all        06/09/2022    8:32 AM 02/07/2022    2:44 PM 08/02/2021    9:32 AM  Depression screen PHQ 2/9  Decreased Interest 0 0 0  Down, Depressed, Hopeless 0 0 0  PHQ - 2 Score 0 0 0  Altered sleeping 0 0 0  Tired, decreased energy 0 0 0  Change in appetite 0 0 0  Feeling bad or failure about yourself  0 0 0  Trouble concentrating 0 0 0  Moving slowly or fidgety/restless 0 0 0  Suicidal thoughts 0 0 0  PHQ-9 Score 0 0 0  Difficult doing work/chores Not difficult at all Not difficult at all Not difficult at all    BP Readings from Last 3 Encounters:  06/09/22 132/88  02/07/22 128/78  08/02/21 122/78    Physical Exam Vitals and nursing note reviewed.  Constitutional:      General: She is not in acute distress.    Appearance: She is well-developed.  HENT:     Head: Normocephalic and atraumatic.  Eyes:     General: No scleral icterus.       Right eye: No discharge.        Left eye: No discharge.     Conjunctiva/sclera: Conjunctivae normal.  Neck:     Thyroid: No thyromegaly.     Vascular:  No carotid bruit.  Cardiovascular:     Rate and Rhythm: Normal rate and regular rhythm.     Pulses: Normal pulses.     Heart sounds: Normal heart sounds.  Pulmonary:     Effort: Pulmonary effort is normal. No respiratory distress.     Breath sounds: No wheezing.  Abdominal:     General: Bowel sounds are normal.     Palpations: Abdomen is soft.     Tenderness: There is no abdominal tenderness.  Musculoskeletal:  Cervical back: Normal range of motion. No erythema.     Right lower leg: No edema.     Left lower leg: No edema.  Lymphadenopathy:     Cervical: No cervical adenopathy.  Skin:    General: Skin is warm and dry.     Findings: No rash.  Neurological:     Mental Status: She is alert and oriented to person, place, and time.     Cranial Nerves: No cranial nerve deficit.     Sensory: No sensory deficit.     Deep Tendon Reflexes: Reflexes are normal and symmetric.  Psychiatric:        Attention and Perception: Attention normal.        Mood and Affect: Mood normal.     Wt Readings from Last 3 Encounters:  06/09/22 249 lb 9.6 oz (113.2 kg)  02/07/22 251 lb (113.9 kg)  08/02/21 242 lb (109.8 kg)    BP 132/88 (BP Location: Left Arm, Cuff Size: Large)   Pulse 96   Ht 5\' 10"  (1.778 m)   Wt 249 lb 9.6 oz (113.2 kg)   SpO2 98%   BMI 35.81 kg/m   Assessment and Plan:  Problem List Items Addressed This Visit       Endocrine   Acquired hypothyroidism (Chronic)    Supplemented Lab Results  Component Value Date   TSH 4.790 (H) 02/07/2022        Relevant Orders   TSH + free T4     Other   B12 nutritional deficiency    Lab Results  Component Value Date   VITAMINB12 511 11/11/2020  Supplemented by injection then discontinued Not on oral at this time       Relevant Orders   CBC with Differential/Platelet   Vitamin B12   Prediabetes (Chronic)    improved with diet changes and weight loss Lab Results  Component Value Date   HGBA1C 5.7 11/11/2020         Relevant Orders   Comprehensive metabolic panel   Hemoglobin A1c   Vitamin D deficiency (Chronic)    Daily supplement recommended      Relevant Orders   VITAMIN D 25 Hydroxy (Vit-D Deficiency, Fractures)   Other Visit Diagnoses     Annual physical exam    -  Primary   Relevant Orders   CBC with Differential/Platelet   Comprehensive metabolic panel   Vitamin B12   Hemoglobin A1c   Lipid panel   TSH + free T4   VITAMIN D 25 Hydroxy (Vit-D Deficiency, Fractures)   Hypertriglyceridemia       Relevant Orders   Lipid panel   Elevated blood pressure reading       monitor at home/work return if persistently elevated  DASH diet given       Return in about 6 months (around 12/09/2022).   Partially dictated using Dragon software, any errors are not intentional.  Reubin MilanLaura H. Johniya Durfee, MD [facility] [place], [place]

## 2022-06-09 NOTE — Assessment & Plan Note (Addendum)
improved with diet changes and weight loss Lab Results  Component Value Date   HGBA1C 5.7 11/11/2020

## 2022-06-09 NOTE — Assessment & Plan Note (Signed)
Supplemented Lab Results  Component Value Date   TSH 4.790 (H) 02/07/2022

## 2022-06-10 LAB — CBC WITH DIFFERENTIAL/PLATELET
Basophils Absolute: 0.1 10*3/uL (ref 0.0–0.2)
Basos: 1 %
EOS (ABSOLUTE): 0.3 10*3/uL (ref 0.0–0.4)
Eos: 5 %
Hematocrit: 42.8 % (ref 34.0–46.6)
Hemoglobin: 14.5 g/dL (ref 11.1–15.9)
Immature Grans (Abs): 0 10*3/uL (ref 0.0–0.1)
Immature Granulocytes: 0 %
Lymphocytes Absolute: 1.7 10*3/uL (ref 0.7–3.1)
Lymphs: 28 %
MCH: 30.2 pg (ref 26.6–33.0)
MCHC: 33.9 g/dL (ref 31.5–35.7)
MCV: 89 fL (ref 79–97)
Monocytes Absolute: 0.4 10*3/uL (ref 0.1–0.9)
Monocytes: 7 %
Neutrophils Absolute: 3.6 10*3/uL (ref 1.4–7.0)
Neutrophils: 59 %
Platelets: 275 10*3/uL (ref 150–450)
RBC: 4.8 x10E6/uL (ref 3.77–5.28)
RDW: 12.2 % (ref 11.7–15.4)
WBC: 6 10*3/uL (ref 3.4–10.8)

## 2022-06-10 LAB — COMPREHENSIVE METABOLIC PANEL
ALT: 26 IU/L (ref 0–32)
AST: 21 IU/L (ref 0–40)
Albumin/Globulin Ratio: 1.8 (ref 1.2–2.2)
Albumin: 4.8 g/dL (ref 3.9–4.9)
Alkaline Phosphatase: 77 IU/L (ref 44–121)
BUN/Creatinine Ratio: 18 (ref 9–23)
BUN: 14 mg/dL (ref 6–24)
Bilirubin Total: 0.4 mg/dL (ref 0.0–1.2)
CO2: 23 mmol/L (ref 20–29)
Calcium: 9.8 mg/dL (ref 8.7–10.2)
Chloride: 100 mmol/L (ref 96–106)
Creatinine, Ser: 0.76 mg/dL (ref 0.57–1.00)
Globulin, Total: 2.6 g/dL (ref 1.5–4.5)
Glucose: 100 mg/dL — ABNORMAL HIGH (ref 70–99)
Potassium: 4.5 mmol/L (ref 3.5–5.2)
Sodium: 139 mmol/L (ref 134–144)
Total Protein: 7.4 g/dL (ref 6.0–8.5)
eGFR: 99 mL/min/{1.73_m2} (ref >59)

## 2022-06-10 LAB — LIPID PANEL
Chol/HDL Ratio: 3.9 ratio (ref 0.0–4.4)
Cholesterol, Total: 212 mg/dL — ABNORMAL HIGH (ref 100–199)
HDL: 54 mg/dL (ref >39)
LDL Chol Calc (NIH): 133 mg/dL — ABNORMAL HIGH (ref 0–99)
Triglycerides: 139 mg/dL (ref 0–149)
VLDL Cholesterol Cal: 25 mg/dL (ref 5–40)

## 2022-06-10 LAB — TSH+FREE T4
Free T4: 1.48 ng/dL (ref 0.82–1.77)
TSH: 2.39 u[IU]/mL (ref 0.450–4.500)

## 2022-06-10 LAB — VITAMIN B12: Vitamin B-12: 588 pg/mL (ref 232–1245)

## 2022-06-10 LAB — VITAMIN D 25 HYDROXY (VIT D DEFICIENCY, FRACTURES): Vit D, 25-Hydroxy: 11.4 ng/mL — ABNORMAL LOW (ref 30.0–100.0)

## 2022-06-10 LAB — HEMOGLOBIN A1C
Est. average glucose Bld gHb Est-mCnc: 120 mg/dL
Hgb A1c MFr Bld: 5.8 % — ABNORMAL HIGH (ref 4.8–5.6)

## 2022-06-16 ENCOUNTER — Other Ambulatory Visit: Payer: Self-pay | Admitting: Internal Medicine

## 2022-06-16 ENCOUNTER — Encounter: Payer: Self-pay | Admitting: Internal Medicine

## 2022-06-16 DIAGNOSIS — R030 Elevated blood-pressure reading, without diagnosis of hypertension: Secondary | ICD-10-CM

## 2022-06-16 MED ORDER — LOSARTAN POTASSIUM 50 MG PO TABS: 50.0000 mg | ORAL_TABLET | Freq: Every day | ORAL | 0 refills | Status: DC

## 2022-06-16 NOTE — Telephone Encounter (Signed)
Please review.  KP

## 2022-07-17 ENCOUNTER — Ambulatory Visit: Payer: BC Managed Care – PPO | Admitting: Internal Medicine

## 2022-07-18 ENCOUNTER — Ambulatory Visit: Payer: BC Managed Care – PPO | Admitting: Internal Medicine

## 2022-07-18 ENCOUNTER — Encounter: Payer: Self-pay | Admitting: Internal Medicine

## 2022-07-18 VITALS — BP 152/100 | HR 100 | Ht 70.0 in | Wt 256.0 lb

## 2022-07-18 DIAGNOSIS — G43009 Migraine without aura, not intractable, without status migrainosus: Secondary | ICD-10-CM | POA: Diagnosis not present

## 2022-07-18 DIAGNOSIS — I10 Essential (primary) hypertension: Secondary | ICD-10-CM | POA: Diagnosis not present

## 2022-07-18 MED ORDER — AMLODIPINE BESYLATE 5 MG PO TABS: 5.0000 mg | ORAL_TABLET | Freq: Every day | ORAL | 0 refills | Status: DC

## 2022-07-18 NOTE — Assessment & Plan Note (Signed)
New onset typical symptoms Continue BC or Goody powders PRN Can try Excedrin Migraine If desired, can call for prescription Imitrex

## 2022-07-18 NOTE — Progress Notes (Signed)
Date:  07/18/2022   Name:  Alicia Horn   DOB:  1980-08-19   MRN:  [id number]   Chief Complaint: Hypertension  Hypertension This is a new problem. The problem is uncontrolled. Associated symptoms include headaches (two migrainous headaches in the last few weeks). Pertinent negatives include no chest pain or shortness of breath. There are no known risk factors for coronary artery disease. Past treatments include angiotensin blockers. The current treatment provides mild improvement.  Migraine  This is a new problem. The current episode started more than 1 month ago. The pain is located in the Occipital region. The pain does not radiate. Similar to prior headaches: no previous HA. Pertinent negatives include no fever. Her past medical history is significant for hypertension.  Mother and sister have migraines.  She is not aware of any trigger for the recent headaches.  Lab Results  Component Value Date   NA 139 06/09/2022   K 4.5 06/09/2022   CO2 23 06/09/2022   GLUCOSE 100 (H) 06/09/2022   BUN 14 06/09/2022   CREATININE 0.76 06/09/2022   CALCIUM 9.8 06/09/2022   EGFR 99 06/09/2022   GFRNONAA 110 01/02/2018   Lab Results  Component Value Date   CHOL 212 (H) 06/09/2022   HDL 54 06/09/2022   LDLCALC 133 (H) 06/09/2022   TRIG 139 06/09/2022   CHOLHDL 3.9 06/09/2022   Lab Results  Component Value Date   TSH 2.390 06/09/2022   Lab Results  Component Value Date   HGBA1C 5.8 (H) 06/09/2022   Lab Results  Component Value Date   WBC 6.0 06/09/2022   HGB 14.5 06/09/2022   HCT 42.8 06/09/2022   MCV 89 06/09/2022   PLT 275 06/09/2022   Lab Results  Component Value Date   ALT 26 06/09/2022   AST 21 06/09/2022   ALKPHOS 77 06/09/2022   BILITOT 0.4 06/09/2022   Lab Results  Component Value Date   VD25OH 11.4 (L) 06/09/2022     Review of Systems  Constitutional:  Negative for chills, fatigue and fever.  Respiratory:  Negative for chest tightness and shortness of  breath.   Cardiovascular:  Negative for chest pain.  Neurological:  Positive for headaches (two migrainous headaches in the last few weeks).    Patient Active Problem List   Diagnosis Date Noted   Essential hypertension 07/18/2022   Migraine without aura and without status migrainosus, not intractable 07/18/2022   Vitamin D deficiency 12/15/2019   B12 nutritional deficiency 12/15/2019   Family history of BRCA gene mutation 11/17/2017   Pap smear abnormality of cervix with LGSIL 06/08/2016   Umbilical hernia without obstruction and without gangrene    Prediabetes 03/02/2015   Acquired hypothyroidism 02/03/2015   BMI 35.0-35.9,adult 05/26/2014    Allergies  Allergen Reactions   Acyclovir And Related    Amoxicillin Other (See Comments)   Cefuroxime Axetil Diarrhea and Nausea And Vomiting    Other reaction(s): Nausea    Past Surgical History:  Procedure Laterality Date   EXPLORATORY LAPAROTOMY WITH ABDOMINAL MASS EXCISION N/A 10/14/2015   excision of umbilical granuloma   UMBILICAL GRANULOMA EXCISION  10/2015   WISDOM TOOTH EXTRACTION      Social History   Tobacco Use   Smoking status: Never   Smokeless tobacco: Never  Substance Use Topics   Alcohol use: Yes    Alcohol/week: 2.0 standard drinks of alcohol    Types: 2 Standard drinks or equivalent per week     Medication  list has been reviewed and updated.  Current Meds  Medication Sig   amLODipine (NORVASC) 5 MG tablet Take 1 tablet (5 mg total) by mouth daily.   levonorgestrel (LILETTA) 20.1 MCG/DAY IUD 1 each by Intrauterine route once.   levothyroxine (SYNTHROID) 125 MCG tablet TAKE 1 TABLET DAILY (NEED APPOINTMENT FOR FURTHER REFILLS)   [DISCONTINUED] losartan (COZAAR) 50 MG tablet Take 1 tablet (50 mg total) by mouth daily.       06/09/2022    8:32 AM 02/07/2022    2:44 PM 08/02/2021    9:33 AM 07/01/2021    3:29 PM  GAD 7 : Generalized Anxiety Score  Nervous, Anxious, on Edge 0 0 0 0  Control/stop worrying  0 0 0 0  Worry too much - different things 0 0 0 0  Trouble relaxing 0 0 0 0  Restless 0 0 0 0  Easily annoyed or irritable 0 0 0 0  Afraid - awful might happen 0 0 0 0  Total GAD 7 Score 0 0 0 0  Anxiety Difficulty Not difficult at all Not difficult at all Not difficult at all        06/09/2022    8:32 AM 02/07/2022    2:44 PM 08/02/2021    9:32 AM  Depression screen PHQ 2/9  Decreased Interest 0 0 0  Down, Depressed, Hopeless 0 0 0  PHQ - 2 Score 0 0 0  Altered sleeping 0 0 0  Tired, decreased energy 0 0 0  Change in appetite 0 0 0  Feeling bad or failure about yourself  0 0 0  Trouble concentrating 0 0 0  Moving slowly or fidgety/restless 0 0 0  Suicidal thoughts 0 0 0  PHQ-9 Score 0 0 0  Difficult doing work/chores Not difficult at all Not difficult at all Not difficult at all    BP Readings from Last 3 Encounters:  07/18/22 (!) 152/100  06/09/22 132/88  02/07/22 128/78    Physical Exam Vitals and nursing note reviewed.  Constitutional:      General: She is not in acute distress.    Appearance: Normal appearance. She is well-developed.  HENT:     Head: Normocephalic and atraumatic.  Cardiovascular:     Rate and Rhythm: Normal rate and regular rhythm.     Heart sounds: No murmur heard. Pulmonary:     Effort: Pulmonary effort is normal. No respiratory distress.     Breath sounds: No wheezing or rhonchi.  Musculoskeletal:     Cervical back: Normal range of motion.     Right lower leg: No edema.     Left lower leg: No edema.  Skin:    General: Skin is warm and dry.     Findings: No rash.  Neurological:     Mental Status: She is alert and oriented to person, place, and time.  Psychiatric:        Mood and Affect: Mood normal.        Behavior: Behavior normal.     Wt Readings from Last 3 Encounters:  07/18/22 256 lb (116.1 kg)  06/09/22 249 lb 9.6 oz (113.2 kg)  02/07/22 251 lb (113.9 kg)    BP (!) 152/100   Pulse 100   Ht 5\' 10"  (1.778 m)   Wt 256 lb  (116.1 kg)   SpO2 97%   BMI 36.73 kg/m   Assessment and Plan:  Problem List Items Addressed This Visit     Essential hypertension -  Primary    BP not controlled on losartan 50 mg. Will start amlodipine 5 mg and monitor BP Message if side effects are not tolerable       Relevant Medications   amLODipine (NORVASC) 5 MG tablet   Migraine without aura and without status migrainosus, not intractable    New onset typical symptoms Continue BC or Goody powders PRN Can try Excedrin Migraine If desired, can call for prescription Imitrex      Relevant Medications   amLODipine (NORVASC) 5 MG tablet    Return in about 6 weeks (around 08/29/2022) for HTN.   Partially dictated using Dragon software, any errors are not intentional.  Reubin Milan, MD [facility] Primary Care and Sports Medicine [place], [place]

## 2022-07-18 NOTE — Assessment & Plan Note (Addendum)
BP not controlled on losartan 50 mg. Will start amlodipine 5 mg and monitor BP Message if side effects are not tolerable

## 2022-07-28 ENCOUNTER — Encounter: Payer: Self-pay | Admitting: Internal Medicine

## 2022-07-28 NOTE — Telephone Encounter (Signed)
FYI  KP

## 2022-08-14 ENCOUNTER — Other Ambulatory Visit: Payer: Self-pay | Admitting: Internal Medicine

## 2022-08-14 DIAGNOSIS — I10 Essential (primary) hypertension: Secondary | ICD-10-CM

## 2022-08-14 MED ORDER — OLMESARTAN MEDOXOMIL-HCTZ 40-12.5 MG PO TABS: 1.0000 | ORAL_TABLET | Freq: Every day | ORAL | 0 refills | Status: DC

## 2022-08-15 DIAGNOSIS — E782 Mixed hyperlipidemia: Secondary | ICD-10-CM | POA: Insufficient documentation

## 2022-08-30 ENCOUNTER — Ambulatory Visit: Payer: BC Managed Care – PPO | Admitting: Internal Medicine

## 2022-09-11 ENCOUNTER — Other Ambulatory Visit: Payer: Self-pay | Admitting: Internal Medicine

## 2022-09-11 DIAGNOSIS — R030 Elevated blood-pressure reading, without diagnosis of hypertension: Secondary | ICD-10-CM

## 2022-10-02 ENCOUNTER — Ambulatory Visit: Payer: BC Managed Care – PPO | Admitting: Internal Medicine

## 2022-10-02 ENCOUNTER — Encounter: Payer: Self-pay | Admitting: Internal Medicine

## 2022-10-02 VITALS — BP 128/66 | HR 100 | Ht 70.0 in | Wt 259.0 lb

## 2022-10-02 DIAGNOSIS — I10 Essential (primary) hypertension: Secondary | ICD-10-CM

## 2022-10-02 DIAGNOSIS — E039 Hypothyroidism, unspecified: Secondary | ICD-10-CM | POA: Diagnosis not present

## 2022-10-02 MED ORDER — LEVOTHYROXINE SODIUM 125 MCG PO TABS: ORAL_TABLET | ORAL | 1 refills | Status: DC

## 2022-10-02 MED ORDER — OLMESARTAN MEDOXOMIL-HCTZ 40-12.5 MG PO TABS: 1.0000 | ORAL_TABLET | Freq: Every day | ORAL | 1 refills | Status: DC

## 2022-10-02 NOTE — Assessment & Plan Note (Addendum)
BP not controlled last visit on olmesartan. Amlodipine added but not tolerated (edema) so hydrochlorothiazide was added. Doing well with good control and no side effects. Continue low sodium diet.

## 2022-10-02 NOTE — Assessment & Plan Note (Signed)
Stable without symptoms  On levothyroxine 125 mcg daily Lab Results  Component Value Date   TSH 2.390 06/09/2022  Follow up in October for recheck

## 2022-10-02 NOTE — Progress Notes (Signed)
Date:  10/02/2022   Name:  Alicia Horn   DOB:  1980-07-20   MRN:  [id number]   Chief Complaint: Hypertension  Hypertension This is a chronic problem. The problem is controlled. Pertinent negatives include no chest pain, headaches, palpitations, peripheral edema or shortness of breath. Past treatments include angiotensin blockers and diuretics. The current treatment provides significant improvement. Identifiable causes of hypertension include a thyroid problem.  Thyroid Problem Presents for follow-up visit. Patient reports no constipation, diarrhea, fatigue, leg swelling or palpitations. The symptoms have been stable.    Lab Results  Component Value Date   NA 139 06/09/2022   K 4.5 06/09/2022   CO2 23 06/09/2022   GLUCOSE 100 (H) 06/09/2022   BUN 14 06/09/2022   CREATININE 0.76 06/09/2022   CALCIUM 9.8 06/09/2022   EGFR 99 06/09/2022   GFRNONAA 110 01/02/2018   Lab Results  Component Value Date   CHOL 212 (H) 06/09/2022   HDL 54 06/09/2022   LDLCALC 133 (H) 06/09/2022   TRIG 139 06/09/2022   CHOLHDL 3.9 06/09/2022   Lab Results  Component Value Date   TSH 2.390 06/09/2022   Lab Results  Component Value Date   HGBA1C 5.8 (H) 06/09/2022   Lab Results  Component Value Date   WBC 6.0 06/09/2022   HGB 14.5 06/09/2022   HCT 42.8 06/09/2022   MCV 89 06/09/2022   PLT 275 06/09/2022   Lab Results  Component Value Date   ALT 26 06/09/2022   AST 21 06/09/2022   ALKPHOS 77 06/09/2022   BILITOT 0.4 06/09/2022   Lab Results  Component Value Date   VD25OH 11.4 (L) 06/09/2022     Review of Systems  Constitutional:  Negative for fatigue and unexpected weight change.  Eyes:  Negative for visual disturbance.  Respiratory:  Negative for cough, chest tightness, shortness of breath and wheezing.   Cardiovascular:  Negative for chest pain, palpitations and leg swelling.  Gastrointestinal:  Negative for abdominal pain, constipation and diarrhea.  Neurological:   Positive for light-headedness (brief episodes intermittently). Negative for dizziness, weakness and headaches.    Patient Active Problem List   Diagnosis Date Noted   Mixed hyperlipidemia 08/15/2022   Essential hypertension 07/18/2022   Migraine without aura and without status migrainosus, not intractable 07/18/2022   Vitamin D deficiency 12/15/2019   B12 nutritional deficiency 12/15/2019   Family history of BRCA gene mutation 11/17/2017   Pap smear abnormality of cervix with LGSIL 06/08/2016   Umbilical hernia without obstruction and without gangrene    Prediabetes 03/02/2015   Acquired hypothyroidism 02/03/2015   BMI 35.0-35.9,adult 05/26/2014    Allergies  Allergen Reactions   Amlodipine Swelling   Acyclovir And Related    Amoxicillin Other (See Comments)   Cefuroxime Axetil Diarrhea and Nausea And Vomiting    Other reaction(s): Nausea    Past Surgical History:  Procedure Laterality Date   EXPLORATORY LAPAROTOMY WITH ABDOMINAL MASS EXCISION N/A 10/14/2015   excision of umbilical granuloma   UMBILICAL GRANULOMA EXCISION  10/2015   WISDOM TOOTH EXTRACTION      Social History   Tobacco Use   Smoking status: Never   Smokeless tobacco: Never  Substance Use Topics   Alcohol use: Yes    Alcohol/week: 2.0 standard drinks of alcohol    Types: 2 Standard drinks or equivalent per week     Medication list has been reviewed and updated.  Current Meds  Medication Sig   levonorgestrel (LILETTA) 20.1 MCG/DAY  IUD 1 each by Intrauterine route once.   [DISCONTINUED] levothyroxine (SYNTHROID) 125 MCG tablet TAKE 1 TABLET DAILY (NEED APPOINTMENT FOR FURTHER REFILLS)   [DISCONTINUED] olmesartan-hydrochlorothiazide (BENICAR HCT) 40-12.5 MG tablet Take 1 tablet by mouth daily.       10/02/2022    3:21 PM 06/09/2022    8:32 AM 02/07/2022    2:44 PM 08/02/2021    9:33 AM  GAD 7 : Generalized Anxiety Score  Nervous, Anxious, on Edge 0 0 0 0  Control/stop worrying 0 0 0 0  Worry too  much - different things 0 0 0 0  Trouble relaxing 0 0 0 0  Restless 0 0 0 0  Easily annoyed or irritable 0 0 0 0  Afraid - awful might happen 0 0 0 0  Total GAD 7 Score 0 0 0 0  Anxiety Difficulty Not difficult at all Not difficult at all Not difficult at all Not difficult at all       10/02/2022    3:21 PM 06/09/2022    8:32 AM 02/07/2022    2:44 PM  Depression screen PHQ 2/9  Decreased Interest 0 0 0  Down, Depressed, Hopeless 0 0 0  PHQ - 2 Score 0 0 0  Altered sleeping 0 0 0  Tired, decreased energy 0 0 0  Change in appetite 0 0 0  Feeling bad or failure about yourself  0 0 0  Trouble concentrating 0 0 0  Moving slowly or fidgety/restless 0 0 0  Suicidal thoughts 0 0 0  PHQ-9 Score 0 0 0  Difficult doing work/chores Not difficult at all Not difficult at all Not difficult at all    BP Readings from Last 3 Encounters:  10/02/22 128/66  07/18/22 (!) 152/100  06/09/22 132/88    Physical Exam Vitals and nursing note reviewed.  Constitutional:      General: She is not in acute distress.    Appearance: She is well-developed.  HENT:     Head: Normocephalic and atraumatic.  Neck:     Vascular: No carotid bruit.  Cardiovascular:     Rate and Rhythm: Normal rate and regular rhythm.     Heart sounds: No murmur heard. Pulmonary:     Effort: Pulmonary effort is normal. No respiratory distress.     Breath sounds: No wheezing or rhonchi.  Musculoskeletal:     Cervical back: Normal range of motion.     Right lower leg: No edema.     Left lower leg: No edema.  Lymphadenopathy:     Cervical: No cervical adenopathy.  Skin:    General: Skin is warm and dry.     Findings: No rash.  Neurological:     Mental Status: She is alert and oriented to person, place, and time.  Psychiatric:        Mood and Affect: Mood normal.        Behavior: Behavior normal.     Wt Readings from Last 3 Encounters:  10/02/22 259 lb (117.5 kg)  07/18/22 256 lb (116.1 kg)  06/09/22 249 lb 9.6 oz  (113.2 kg)    BP 128/66   Pulse 100   Ht 5\' 10"  (1.778 m)   Wt 259 lb (117.5 kg)   SpO2 98%   BMI 37.16 kg/m   Assessment and Plan:  Problem List Items Addressed This Visit       Unprioritized   Essential hypertension - Primary    BP not controlled last visit on  olmesartan. Amlodipine added but not tolerated (edema) so hydrochlorothiazide was added. Doing well with good control and no side effects. Continue low sodium diet.      Relevant Medications   olmesartan-hydrochlorothiazide (BENICAR HCT) 40-12.5 MG tablet   Acquired hypothyroidism (Chronic)    Stable without symptoms  On levothyroxine 125 mcg daily Lab Results  Component Value Date   TSH 2.390 06/09/2022  Follow up in October for recheck      Relevant Medications   levothyroxine (SYNTHROID) 125 MCG tablet    No follow-ups on file.    Reubin Milan, MD [facility] Primary Care and Sports Medicine [place]

## 2022-10-14 ENCOUNTER — Other Ambulatory Visit: Payer: Self-pay | Admitting: Internal Medicine

## 2022-10-14 DIAGNOSIS — I10 Essential (primary) hypertension: Secondary | ICD-10-CM

## 2022-10-16 NOTE — Telephone Encounter (Signed)
Unable to refill per protocol, Rx expired. Discontinued 08/14/22.  Requested Prescriptions  Pending Prescriptions Disp Refills   amLODipine (NORVASC) 5 MG tablet [Pharmacy Med Name: AMLODIPINE BESYLATE 5MG  TABLETS] 90 tablet 0    Sig: TAKE 1 TABLET(5 MG) BY MOUTH DAILY     Cardiovascular: Calcium Channel Blockers 2 Passed - 10/14/2022  3:43 AM      Passed - Last BP in normal range    BP Readings from Last 1 Encounters:  10/02/22 128/66         Passed - Last Heart Rate in normal range    Pulse Readings from Last 1 Encounters:  10/02/22 100         Passed - Valid encounter within last 6 months    Recent Outpatient Visits           2 weeks ago Essential hypertension   [place] [facility] at [facility], Nyoka Cowden, MD   3 months ago Essential hypertension   [place] [facility] at [facility], Nyoka Cowden, MD   4 months ago Annual physical exam   [facility] & [facility] Medicine at [facility], Nyoka Cowden, MD   8 months ago Acquired hypothyroidism   [facility] Primary Care & [facility] Medicine at [facility], Nyoka Cowden, MD   1 year ago BMI 35.0-35.9,adult   [facility] at [facility], Nyoka Cowden, MD       Future Appointments             In 1 month Judithann Graves, Nyoka Cowden, MD [facility] Primary Care & Sports Medicine at [facility], [facility]

## 2022-11-14 ENCOUNTER — Other Ambulatory Visit: Payer: Self-pay | Admitting: Internal Medicine

## 2022-11-14 DIAGNOSIS — I10 Essential (primary) hypertension: Secondary | ICD-10-CM

## 2022-12-11 ENCOUNTER — Encounter: Payer: Self-pay | Admitting: Internal Medicine

## 2022-12-11 ENCOUNTER — Ambulatory Visit: Payer: BC Managed Care – PPO | Admitting: Internal Medicine

## 2022-12-11 VITALS — BP 120/78 | HR 87 | Ht 70.0 in | Wt 256.0 lb

## 2022-12-11 DIAGNOSIS — E039 Hypothyroidism, unspecified: Secondary | ICD-10-CM | POA: Diagnosis not present

## 2022-12-11 DIAGNOSIS — E559 Vitamin D deficiency, unspecified: Secondary | ICD-10-CM | POA: Diagnosis not present

## 2022-12-11 DIAGNOSIS — Z1159 Encounter for screening for other viral diseases: Secondary | ICD-10-CM

## 2022-12-11 NOTE — Assessment & Plan Note (Signed)
Now taking supplement intermittently

## 2022-12-11 NOTE — Assessment & Plan Note (Signed)
Stable on current dose.  No concerns or new issues. Lab Results  Component Value Date   TSH 2.390 06/09/2022

## 2022-12-11 NOTE — Progress Notes (Signed)
Date:  12/11/2022   Name:  Alicia Horn   DOB:  Sep 26, 1980   MRN:  [id number]   Chief Complaint: Hypothyroidism  Thyroid Problem Presents for follow-up visit. Patient reports no constipation, diarrhea, heat intolerance, hoarse voice, nail problem, weight gain or weight loss. The symptoms have been stable.    Lab Results  Component Value Date   NA 139 06/09/2022   K 4.5 06/09/2022   CO2 23 06/09/2022   GLUCOSE 100 (H) 06/09/2022   BUN 14 06/09/2022   CREATININE 0.76 06/09/2022   CALCIUM 9.8 06/09/2022   EGFR 99 06/09/2022   GFRNONAA 110 01/02/2018   Lab Results  Component Value Date   CHOL 212 (H) 06/09/2022   HDL 54 06/09/2022   LDLCALC 133 (H) 06/09/2022   TRIG 139 06/09/2022   CHOLHDL 3.9 06/09/2022   Lab Results  Component Value Date   TSH 2.390 06/09/2022   Lab Results  Component Value Date   HGBA1C 5.8 (H) 06/09/2022   Lab Results  Component Value Date   WBC 6.0 06/09/2022   HGB 14.5 06/09/2022   HCT 42.8 06/09/2022   MCV 89 06/09/2022   PLT 275 06/09/2022   Lab Results  Component Value Date   ALT 26 06/09/2022   AST 21 06/09/2022   ALKPHOS 77 06/09/2022   BILITOT 0.4 06/09/2022   Lab Results  Component Value Date   VD25OH 11.4 (L) 06/09/2022     Patient Active Problem List   Diagnosis Date Noted   Mixed hyperlipidemia 08/15/2022   Essential hypertension 07/18/2022   Migraine without aura and without status migrainosus, not intractable 07/18/2022   Vitamin D deficiency 12/15/2019   B12 nutritional deficiency 12/15/2019   Family history of BRCA gene mutation 11/17/2017   Pap smear abnormality of cervix with LGSIL 06/08/2016   Umbilical hernia without obstruction and without gangrene    Prediabetes 03/02/2015   Acquired hypothyroidism 02/03/2015   BMI 35.0-35.9,adult 05/26/2014    Allergies  Allergen Reactions   Amlodipine Swelling   Acyclovir And Related    Amoxicillin Other (See Comments)   Cefuroxime Axetil Diarrhea and  Nausea And Vomiting    Other reaction(s): Nausea    Past Surgical History:  Procedure Laterality Date   EXPLORATORY LAPAROTOMY WITH ABDOMINAL MASS EXCISION N/A 10/14/2015   excision of umbilical granuloma   UMBILICAL GRANULOMA EXCISION  10/2015   WISDOM TOOTH EXTRACTION      Social History   Tobacco Use   Smoking status: Never   Smokeless tobacco: Never  Substance Use Topics   Alcohol use: Yes    Alcohol/week: 2.0 standard drinks of alcohol    Types: 2 Standard drinks or equivalent per week     Medication list has been reviewed and updated.  Current Meds  Medication Sig   levonorgestrel (LILETTA) 20.1 MCG/DAY IUD 1 each by Intrauterine route once.   levothyroxine (SYNTHROID) 125 MCG tablet TAKE 1 TABLET DAILY   olmesartan-hydrochlorothiazide (BENICAR HCT) 40-12.5 MG tablet TAKE 1 TABLET BY MOUTH DAILY       12/11/2022    9:38 AM 10/02/2022    3:21 PM 06/09/2022    8:32 AM 02/07/2022    2:44 PM  GAD 7 : Generalized Anxiety Score  Nervous, Anxious, on Edge 0 0 0 0  Control/stop worrying 0 0 0 0  Worry too much - different things 0 0 0 0  Trouble relaxing 0 0 0 0  Restless 0 0 0 0  Easily annoyed or  irritable 0 0 0 0  Afraid - awful might happen 0 0 0 0  Total GAD 7 Score 0 0 0 0  Anxiety Difficulty Not difficult at all Not difficult at all Not difficult at all Not difficult at all       12/11/2022    9:38 AM 10/02/2022    3:21 PM 06/09/2022    8:32 AM  Depression screen PHQ 2/9  Decreased Interest 0 0 0  Down, Depressed, Hopeless 0 0 0  PHQ - 2 Score 0 0 0  Altered sleeping 0 0 0  Tired, decreased energy 0 0 0  Change in appetite 0 0 0  Feeling bad or failure about yourself  0 0 0  Trouble concentrating 0 0 0  Moving slowly or fidgety/restless 0 0 0  Suicidal thoughts 0 0 0  PHQ-9 Score 0 0 0  Difficult doing work/chores Not difficult at all Not difficult at all Not difficult at all    BP Readings from Last 3 Encounters:  12/11/22 120/78  10/02/22 128/66   07/18/22 (!) 152/100    Physical Exam Vitals and nursing note reviewed.  Constitutional:      General: She is not in acute distress.    Appearance: Normal appearance. She is well-developed.  HENT:     Head: Normocephalic and atraumatic.  Neck:     Vascular: No carotid bruit.  Cardiovascular:     Rate and Rhythm: Normal rate and regular rhythm.     Pulses: Normal pulses.  Pulmonary:     Effort: Pulmonary effort is normal. No respiratory distress.     Breath sounds: No wheezing or rhonchi.  Musculoskeletal:        General: Normal range of motion.     Cervical back: Normal range of motion. No tenderness.     Right lower leg: No edema.     Left lower leg: No edema.  Lymphadenopathy:     Cervical: No cervical adenopathy.  Skin:    General: Skin is warm and dry.     Findings: No rash.  Neurological:     General: No focal deficit present.     Mental Status: She is alert and oriented to person, place, and time.  Psychiatric:        Mood and Affect: Mood normal.        Behavior: Behavior normal.     Wt Readings from Last 3 Encounters:  12/11/22 256 lb (116.1 kg)  10/02/22 259 lb (117.5 kg)  07/18/22 256 lb (116.1 kg)    BP 120/78   Pulse 87   Ht 5\' 10"  (1.778 m)   Wt 256 lb (116.1 kg)   SpO2 98%   BMI 36.73 kg/m   Assessment and Plan:  Problem List Items Addressed This Visit       Unprioritized   Acquired hypothyroidism - Primary (Chronic)    Stable on current dose.  No concerns or new issues. Lab Results  Component Value Date   TSH 2.390 06/09/2022         Relevant Orders   TSH + free T4   Vitamin D deficiency (Chronic)    Now taking supplement intermittently      Relevant Orders   VITAMIN D 25 Hydroxy (Vit-D Deficiency, Fractures)   Other Visit Diagnoses     Need for hepatitis C screening test       Relevant Orders   Hepatitis C antibody       Return in about 6  months (around 06/11/2023) for CPX.    Reubin Milan, MD [facility]  Primary Care and Sports Medicine [place]

## 2022-12-12 LAB — HEPATITIS C ANTIBODY: Hep C Virus Ab: NONREACTIVE

## 2022-12-12 LAB — TSH+FREE T4
Free T4: 1.34 ng/dL (ref 0.82–1.77)
TSH: 2.08 u[IU]/mL (ref 0.450–4.500)

## 2022-12-12 LAB — VITAMIN D 25 HYDROXY (VIT D DEFICIENCY, FRACTURES): Vit D, 25-Hydroxy: 21.6 ng/mL — ABNORMAL LOW (ref 30.0–100.0)

## 2022-12-12 NOTE — Progress Notes (Signed)
PC to pt, discussed labs, voiced understanding.

## 2022-12-26 ENCOUNTER — Other Ambulatory Visit: Payer: Self-pay | Admitting: Obstetrics and Gynecology

## 2022-12-26 DIAGNOSIS — Z1231 Encounter for screening mammogram for malignant neoplasm of breast: Secondary | ICD-10-CM | POA: Diagnosis not present

## 2022-12-26 DIAGNOSIS — Z01419 Encounter for gynecological examination (general) (routine) without abnormal findings: Secondary | ICD-10-CM | POA: Diagnosis not present

## 2022-12-26 DIAGNOSIS — Z1331 Encounter for screening for depression: Secondary | ICD-10-CM | POA: Diagnosis not present

## 2023-01-18 DIAGNOSIS — A499 Bacterial infection, unspecified: Secondary | ICD-10-CM | POA: Diagnosis not present

## 2023-02-07 ENCOUNTER — Ambulatory Visit
Admission: RE | Admit: 2023-02-07 | Discharge: 2023-02-07 | Disposition: A | Discharge status: Home / Self Care | Payer: BC Managed Care – PPO | Source: Ambulatory Visit | Attending: Obstetrics and Gynecology | Admitting: Obstetrics and Gynecology

## 2023-02-07 DIAGNOSIS — Z1231 Encounter for screening mammogram for malignant neoplasm of breast: Secondary | ICD-10-CM | POA: Insufficient documentation

## 2023-02-22 DIAGNOSIS — J309 Allergic rhinitis, unspecified: Secondary | ICD-10-CM | POA: Diagnosis not present

## 2023-02-22 DIAGNOSIS — R0981 Nasal congestion: Secondary | ICD-10-CM | POA: Diagnosis not present

## 2023-02-22 DIAGNOSIS — R059 Cough, unspecified: Secondary | ICD-10-CM | POA: Diagnosis not present

## 2023-02-22 DIAGNOSIS — J329 Chronic sinusitis, unspecified: Secondary | ICD-10-CM | POA: Diagnosis not present

## 2023-02-22 DIAGNOSIS — J301 Allergic rhinitis due to pollen: Secondary | ICD-10-CM | POA: Diagnosis not present

## 2023-03-14 DIAGNOSIS — R0981 Nasal congestion: Secondary | ICD-10-CM | POA: Diagnosis not present

## 2023-03-14 DIAGNOSIS — J329 Chronic sinusitis, unspecified: Secondary | ICD-10-CM | POA: Diagnosis not present

## 2023-03-14 DIAGNOSIS — R059 Cough, unspecified: Secondary | ICD-10-CM | POA: Diagnosis not present

## 2023-03-14 DIAGNOSIS — J309 Allergic rhinitis, unspecified: Secondary | ICD-10-CM | POA: Diagnosis not present

## 2023-04-12 ENCOUNTER — Other Ambulatory Visit: Payer: Self-pay | Admitting: Internal Medicine

## 2023-04-12 DIAGNOSIS — E039 Hypothyroidism, unspecified: Secondary | ICD-10-CM

## 2023-04-13 NOTE — Telephone Encounter (Signed)
 Requested Prescriptions  Pending Prescriptions Disp Refills   levothyroxine  (SYNTHROID ) 125 MCG tablet [Pharmacy Med Name: L-THYROXINE TABS 125MCG] 90 tablet 0    Sig: TAKE 1 TABLET DAILY     Endocrinology:  Hypothyroid Agents Passed - 04/13/2023  8:25 AM      Passed - TSH in normal range and within 360 days    TSH  Date Value Ref Range Status  12/11/2022 2.080 0.450 - 4.500 uIU/mL Final         Passed - Valid encounter within last 12 months    Recent Outpatient Visits           4 months ago Acquired hypothyroidism   [place] [facility] at [facility], Leita DEL, MD   6 months ago Essential hypertension   [place] [facility] Medicine at [facility], Leita DEL, MD   8 months ago Essential hypertension   [place] [facility] Medicine at [facility], Leita DEL, MD   10 months ago Annual physical exam   [facility] Primary Care & Sports Medicine at [facility], Leita DEL, MD   1 year ago Acquired hypothyroidism   [facility] Medicine at [facility], Leita DEL, MD       Future Appointments             In 1 month [place], Leita DEL, MD [facility] Primary Care & Sports Medicine at [facility], [facility]

## 2023-04-16 ENCOUNTER — Other Ambulatory Visit: Payer: Self-pay | Admitting: Internal Medicine

## 2023-04-16 DIAGNOSIS — I10 Essential (primary) hypertension: Secondary | ICD-10-CM

## 2023-04-17 NOTE — Telephone Encounter (Signed)
Requested Prescriptions  Pending Prescriptions Disp Refills   olmesartan-hydrochlorothiazide (BENICAR HCT) 40-12.5 MG tablet [Pharmacy Med Name: OLMESARTAN/HCTZ TABS 40/12.5MG ] 90 tablet 1    Sig: TAKE 1 TABLET DAILY     Cardiovascular: ARB + Diuretic Combos Failed - 04/17/2023  8:45 AM      Failed - K in normal range and within 180 days    Potassium  Date Value Ref Range Status  06/09/2022 4.5 3.5 - 5.2 mmol/L Final         Failed - Na in normal range and within 180 days    Sodium  Date Value Ref Range Status  06/09/2022 139 134 - 144 mmol/L Final         Failed - Cr in normal range and within 180 days    Creatinine, Ser  Date Value Ref Range Status  06/09/2022 0.76 0.57 - 1.00 mg/dL Final         Failed - eGFR is 10 or above and within 180 days    GFR calc Af Amer  Date Value Ref Range Status  01/02/2018 127 >59 mL/min/1.73 Final   GFR calc non Af Amer  Date Value Ref Range Status  01/02/2018 110 >59 mL/min/1.73 Final   eGFR  Date Value Ref Range Status  06/09/2022 99 >59 mL/min/1.73 Final         Passed - Patient is not pregnant      Passed - Last BP in normal range    BP Readings from Last 1 Encounters:  12/11/22 120/78         Passed - Valid encounter within last 6 months    Recent Outpatient Visits           4 months ago Acquired hypothyroidism   [place] [facility] at [facility], Nyoka Cowden, MD   6 months ago Essential hypertension   [place] [facility] at [facility], Nyoka Cowden, MD   9 months ago Essential hypertension   [place] [facility] at [facility], Nyoka Cowden, MD   10 months ago Annual physical exam   [facility] at [facility], Nyoka Cowden, MD   1 year ago Acquired hypothyroidism   [facility] at [facility], Nyoka Cowden, MD       Future Appointments              In 1 month Judithann Graves, Nyoka Cowden, MD [facility] Medicine at [facility], [facility]

## 2023-06-04 ENCOUNTER — Ambulatory Visit
Admission: RE | Admit: 2023-06-04 | Discharge: 2023-06-04 | Disposition: A | Discharge status: Home / Self Care | Source: Ambulatory Visit | Attending: Family Medicine | Admitting: Family Medicine

## 2023-06-04 VITALS — BP 160/107 | HR 90 | Temp 98.6°F | Resp 18

## 2023-06-04 DIAGNOSIS — L03211 Cellulitis of face: Secondary | ICD-10-CM

## 2023-06-04 MED ORDER — FLUCONAZOLE 150 MG PO TABS
150.0000 mg | ORAL_TABLET | Freq: Once | ORAL | 0 refills | Status: AC
Start: 2023-06-04 — End: 2023-06-04

## 2023-06-04 MED ORDER — DOXYCYCLINE HYCLATE 100 MG PO CAPS: 100.0000 mg | ORAL_CAPSULE | Freq: Two times a day (BID) | ORAL | 0 refills | Status: DC

## 2023-06-04 NOTE — ED Triage Notes (Signed)
 Patient presents to UC for cyst on her chin, swelling and pain to jaw/neck area since 2 days. Has applied alcohol to area.

## 2023-06-04 NOTE — ED Provider Notes (Signed)
 [facility]    CSN: [id number] Arrival date & time: 06/04/23  0850      History   Chief Complaint Chief Complaint  Patient presents with   Skin Ulcer    Cyst like spot on my chin with some swelling and pain on jaw and neck. - Entered by patient    HPI Alicia Horn is a 43 y.o. female.   HPI  Alicia Horn presents for cyst on her chin that started on Friday.  She treated it with alcohol.  She poked it with needle several times but clear fluid and blood came out.  Though she may have  a spider bite as [place] was at the beach.  Her neck and jaw line started hurting. Took some sinus medication. Has some right ear pressure with movement of her head.   No one else as anything similar that she has been around. No fever, chills, body aches, vomiting, diarrhea, belly pain and headache.   Past Medical History:  Diagnosis Date   Blood glucose elevated 02/03/2015   A1C 6.0 in 2012 - monitored regularly by GYN    Hypothyroidism    Prediabetes 03/02/2015   Resolved with weight loss   Thyroid disease    Umbilical granuloma    Removed surgically    Patient Active Problem List   Diagnosis Date Noted   Mixed hyperlipidemia 08/15/2022   Essential hypertension 07/18/2022   Migraine without aura and without status migrainosus, not intractable 07/18/2022   Vitamin D deficiency 12/15/2019   B12 nutritional deficiency 12/15/2019   Family history of BRCA gene mutation 11/17/2017   Pap smear abnormality of cervix with LGSIL 06/08/2016   Umbilical hernia without obstruction and without gangrene    Prediabetes 03/02/2015   Acquired hypothyroidism 02/03/2015   BMI 35.0-35.9,adult 05/26/2014    Past Surgical History:  Procedure Laterality Date   EXPLORATORY LAPAROTOMY WITH ABDOMINAL MASS EXCISION N/A 10/14/2015   excision of umbilical granuloma   UMBILICAL GRANULOMA EXCISION  10/2015   WISDOM TOOTH EXTRACTION      OB History   No obstetric history on file.      Home  Medications    Prior to Admission medications   Medication Sig Start Date End Date Taking? Authorizing Provider  doxycycline (VIBRAMYCIN) 100 MG capsule Take 1 capsule (100 mg total) by mouth 2 (two) times daily. 06/04/23  Yes Daysean Tinkham, DO  fluconazole (DIFLUCAN) 150 MG tablet Take 1 tablet (150 mg total) by mouth once for 1 dose. 06/04/23 06/04/23 Yes Tru Leopard, Seward Meth, DO  levonorgestrel (LILETTA) 20.1 MCG/DAY IUD 1 each by Intrauterine route once.    [provider]  levothyroxine (SYNTHROID) 125 MCG tablet TAKE 1 TABLET DAILY 04/13/23   Alicia Milan, MD  olmesartan-hydrochlorothiazide [facility] HCT) 40-12.5 MG tablet TAKE 1 TABLET DAILY 04/17/23   Alicia Milan, MD    Family History Family History  Problem Relation Age of Onset   Hyperlipidemia Father    CAD Father    Breast cancer Paternal Grandmother     Social History Social History   Tobacco Use   Smoking status: Never   Smokeless tobacco: Never  Substance Use Topics   Alcohol use: Yes    Alcohol/week: 2.0 standard drinks of alcohol    Types: 2 Standard drinks or equivalent per week     Allergies   Amlodipine, Acyclovir and related, Amoxicillin, and Cefuroxime axetil   Review of Systems Review of Systems :negative unless otherwise stated in HPI.  Physical Exam Triage Vital Signs ED Triage Vitals  Encounter Vitals Group     BP 06/04/23 0923 (!) 160/107     Systolic BP Percentile --      Diastolic BP Percentile --      Pulse Rate 06/04/23 0923 90     Resp 06/04/23 0923 18     Temp 06/04/23 0923 98.6 F (37 C)     Temp Source 06/04/23 0923 Oral     SpO2 06/04/23 0923 98 %     Weight --      Height --      Head Circumference --      Peak Flow --      Pain Score 06/04/23 0922 2     Pain Loc --      Pain Education --      Exclude from Growth Chart --    No data found.  Updated Vital Signs BP (!) 160/107 (BP Location: Left Arm)   Pulse 90   Temp 98.6 F (37 C) (Oral)   Resp 18    SpO2 98%   Visual Acuity Right Eye Distance:   Left Eye Distance:   Bilateral Distance:    Right Eye Near:   Left Eye Near:    Bilateral Near:     Physical Exam  GEN: alert, well appearing female, in no acute distress  EYES:  no scleral injection or discharge HENT: moist mucous membranes,  RESP: no increased work of breathing SKIN: warm and dry; right lower chin with honey crusting and erythema with surrounding induration but no fluctuance      UC Treatments / Results  Labs (all labs ordered are listed, but only abnormal results are displayed) Labs Reviewed - No data to display  EKG   Radiology No results found.  Procedures Procedures (including critical care time)  Medications Ordered in UC Medications - No data to display  Initial Impression / Assessment and Plan / UC Course  I have reviewed the triage vital signs and the nursing notes.  Pertinent labs & imaging results that were available during my care of the patient were reviewed by me and considered in my medical decision making (see chart for details).     Patient is a 43 y.o. femalewho presents for chin rash .  Overall, patient is well-appearing and well-hydrated.  Vital signs stable.  Eesha is afebrile.  Exam concerning for cellulitis.  Treat with doxycycline as below. Pt to call if she needs Diflucan prescription as unable to print Rx today.   Reviewed expectations regarding course of current medical issues.  All questions asked were answered.  Outlined signs and symptoms indicating need for more acute intervention. Patient verbalized understanding. After Visit Summary given.   Final Clinical Impressions(s) / UC Diagnoses   Final diagnoses:  Facial cellulitis     Discharge Instructions      Stop by the pharmacy to pick up your prescriptions.  Follow up with your primary care provider or return to the urgent care, if not improving.       ED Prescriptions     Medication Sig Dispense  Auth. Provider   doxycycline (VIBRAMYCIN) 100 MG capsule Take 1 capsule (100 mg total) by mouth 2 (two) times daily. 20 capsule Ashyia Schraeder, DO   fluconazole (DIFLUCAN) 150 MG tablet Take 1 tablet (150 mg total) by mouth once for 1 dose. 1 tablet Katha Cabal, DO      PDMP not reviewed this encounter.  Katha Cabal, DO 06/04/23 [phone]

## 2023-06-04 NOTE — Discharge Instructions (Addendum)
 Stop by the pharmacy to pick up your prescriptions.  Follow up with your primary care provider or return to the urgent care, if not improving.

## 2023-06-05 DIAGNOSIS — R053 Chronic cough: Secondary | ICD-10-CM | POA: Diagnosis not present

## 2023-06-05 DIAGNOSIS — J301 Allergic rhinitis due to pollen: Secondary | ICD-10-CM | POA: Diagnosis not present

## 2023-06-05 DIAGNOSIS — R0602 Shortness of breath: Secondary | ICD-10-CM | POA: Diagnosis not present

## 2023-06-05 DIAGNOSIS — R058 Other specified cough: Secondary | ICD-10-CM | POA: Diagnosis not present

## 2023-06-11 ENCOUNTER — Ambulatory Visit (INDEPENDENT_AMBULATORY_CARE_PROVIDER_SITE_OTHER): Payer: Self-pay | Admitting: Internal Medicine

## 2023-06-11 ENCOUNTER — Encounter: Payer: Self-pay | Admitting: Internal Medicine

## 2023-06-11 VITALS — BP 126/78 | HR 89 | Ht 70.0 in | Wt 257.5 lb

## 2023-06-11 DIAGNOSIS — Z1211 Encounter for screening for malignant neoplasm of colon: Secondary | ICD-10-CM | POA: Diagnosis not present

## 2023-06-11 DIAGNOSIS — E039 Hypothyroidism, unspecified: Secondary | ICD-10-CM

## 2023-06-11 DIAGNOSIS — E782 Mixed hyperlipidemia: Secondary | ICD-10-CM | POA: Diagnosis not present

## 2023-06-11 DIAGNOSIS — Z0000 Encounter for general adult medical examination without abnormal findings: Secondary | ICD-10-CM | POA: Diagnosis not present

## 2023-06-11 DIAGNOSIS — R7303 Prediabetes: Secondary | ICD-10-CM

## 2023-06-11 DIAGNOSIS — I10 Essential (primary) hypertension: Secondary | ICD-10-CM

## 2023-06-11 DIAGNOSIS — E559 Vitamin D deficiency, unspecified: Secondary | ICD-10-CM | POA: Diagnosis not present

## 2023-06-11 NOTE — Assessment & Plan Note (Signed)
 LDL is  Lab Results  Component Value Date   LDLCALC 133 (H) 06/09/2022   Current regimen is diet.  No medication side effects noted. Goal LDL is <130.

## 2023-06-11 NOTE — Assessment & Plan Note (Signed)
 Blood pressure is well controlled.  Current medications olmesartan and hctz Will continue same regimen along with efforts to limit dietary sodium.

## 2023-06-11 NOTE — Assessment & Plan Note (Signed)
 Supplemented, Check labs and advise

## 2023-06-11 NOTE — Assessment & Plan Note (Signed)
 Continue daily supplements Check labs - consider high dose testing

## 2023-06-11 NOTE — Progress Notes (Signed)
 Date:  06/11/2023   Name:  Alicia Horn   DOB:  05-Jul-1980   MRN:  [id number]   Chief Complaint: Annual Exam Alicia Horn is a 43 y.o. female who presents today for her Complete Annual Exam. She feels well. She reports exercising none. She reports she is sleeping well. Breast complaints none.  Health Maintenance  Topic Date Due   HIV Screening  Never done   DTaP/Tdap/Td vaccine (1 - Tdap) Never done   Colon Cancer Screening  Never done   COVID-19 Vaccine (3 - 2024-25 season) 06/27/2023*   Flu Shot  10/05/2023   Mammogram  02/07/2024   Pap with HPV screening  12/21/2026   Hepatitis C Screening  Completed   HPV Vaccine  Aged Out  *Topic was postponed. The date shown is not the original due date.    Hypertension This is a chronic problem. The problem is controlled. Pertinent negatives include no chest pain, headaches, palpitations or shortness of breath (chronic cough - seen by ENT, Pulm - now on PPI). Past treatments include angiotensin blockers and diuretics. The current treatment provides significant improvement. There is no history of kidney disease, CAD/MI or CVA. Identifiable causes of hypertension include a thyroid problem.  Thyroid Problem Presents for follow-up visit. Patient reports no constipation, diarrhea, fatigue or palpitations. The symptoms have been stable. Her past medical history is significant for hyperlipidemia.  Hyperlipidemia This is a chronic problem. Recent lipid tests were reviewed and are variable. Pertinent negatives include no chest pain, myalgias or shortness of breath (chronic cough - seen by ENT, Pulm - now on PPI). Current antihyperlipidemic treatment includes diet change.    Review of Systems  Constitutional:  Negative for fatigue and unexpected weight change.  HENT:  Negative for trouble swallowing.   Eyes:  Negative for visual disturbance.  Respiratory:  Positive for cough. Negative for chest tightness, shortness of breath (chronic cough - seen  by ENT, Pulm - now on PPI) and wheezing.   Cardiovascular:  Negative for chest pain, palpitations and leg swelling.  Gastrointestinal:  Negative for abdominal pain, constipation and diarrhea.  Musculoskeletal:  Negative for arthralgias and myalgias.  Skin:  Positive for rash (on chin from insect bite - on antibiotics and improving).  Neurological:  Negative for dizziness, weakness, light-headedness and headaches.     Lab Results  Component Value Date   NA 139 06/09/2022   K 4.5 06/09/2022   CO2 23 06/09/2022   GLUCOSE 100 (H) 06/09/2022   BUN 14 06/09/2022   CREATININE 0.76 06/09/2022   CALCIUM 9.8 06/09/2022   EGFR 99 06/09/2022   GFRNONAA 110 01/02/2018   Lab Results  Component Value Date   CHOL 212 (H) 06/09/2022   HDL 54 06/09/2022   LDLCALC 133 (H) 06/09/2022   TRIG 139 06/09/2022   CHOLHDL 3.9 06/09/2022   Lab Results  Component Value Date   TSH 2.080 12/11/2022   Lab Results  Component Value Date   HGBA1C 5.8 (H) 06/09/2022   Lab Results  Component Value Date   WBC 6.0 06/09/2022   HGB 14.5 06/09/2022   HCT 42.8 06/09/2022   MCV 89 06/09/2022   PLT 275 06/09/2022   Lab Results  Component Value Date   ALT 26 06/09/2022   AST 21 06/09/2022   ALKPHOS 77 06/09/2022   BILITOT 0.4 06/09/2022   Lab Results  Component Value Date   VD25OH 21.6 (L) 12/11/2022     Patient Active Problem List   Diagnosis  Date Noted   Mixed hyperlipidemia 08/15/2022   Essential hypertension 07/18/2022   Migraine without aura and without status migrainosus, not intractable 07/18/2022   Vitamin D deficiency 12/15/2019   B12 nutritional deficiency 12/15/2019   Family history of BRCA gene mutation 11/17/2017   Pap smear abnormality of cervix with LGSIL 06/08/2016   Umbilical hernia without obstruction and without gangrene    Prediabetes 03/02/2015   Acquired hypothyroidism 02/03/2015   BMI 35.0-35.9,adult 05/26/2014    Allergies  Allergen Reactions   Amlodipine Swelling    Acyclovir And Related    Amoxicillin Other (See Comments)   Cefuroxime Axetil Diarrhea and Nausea And Vomiting    Other reaction(s): Nausea    Past Surgical History:  Procedure Laterality Date   EXPLORATORY LAPAROTOMY WITH ABDOMINAL MASS EXCISION N/A 10/14/2015   excision of umbilical granuloma   UMBILICAL GRANULOMA EXCISION  10/2015   WISDOM TOOTH EXTRACTION      Social History   Tobacco Use   Smoking status: Never   Smokeless tobacco: Never  Substance Use Topics   Alcohol use: Yes    Alcohol/week: 2.0 standard drinks of alcohol    Types: 2 Standard drinks or equivalent per week     Medication list has been reviewed and updated.  Current Meds  Medication Sig   Cholecalciferol 50 MCG (2000 UT) TABS Take by mouth.   doxycycline (VIBRAMYCIN) 100 MG capsule Take 1 capsule (100 mg total) by mouth 2 (two) times daily.   fluticasone (FLONASE) 50 MCG/ACT nasal spray Place 2 sprays into both nostrils daily.   levonorgestrel (LILETTA) 20.1 MCG/DAY IUD 1 each by Intrauterine route once.   levothyroxine (SYNTHROID) 125 MCG tablet TAKE 1 TABLET DAILY   olmesartan-hydrochlorothiazide (BENICAR HCT) 40-12.5 MG tablet TAKE 1 TABLET DAILY   omeprazole (PRILOSEC) 40 MG capsule Take by mouth.       06/11/2023    9:12 AM 12/11/2022    9:38 AM 10/02/2022    3:21 PM 06/09/2022    8:32 AM  GAD 7 : Generalized Anxiety Score  Nervous, Anxious, on Edge 0 0 0 0  Control/stop worrying 0 0 0 0  Worry too much - different things 0 0 0 0  Trouble relaxing 0 0 0 0  Restless 0 0 0 0  Easily annoyed or irritable 0 0 0 0  Afraid - awful might happen 0 0 0 0  Total GAD 7 Score 0 0 0 0  Anxiety Difficulty Not difficult at all Not difficult at all Not difficult at all Not difficult at all       06/11/2023    9:12 AM 12/11/2022    9:38 AM 10/02/2022    3:21 PM  Depression screen PHQ 2/9  Decreased Interest 0 0 0  Down, Depressed, Hopeless 0 0 0  PHQ - 2 Score 0 0 0  Altered sleeping 0 0 0  Tired,  decreased energy 0 0 0  Change in appetite 0 0 0  Feeling bad or failure about yourself  0 0 0  Trouble concentrating 0 0 0  Moving slowly or fidgety/restless 0 0 0  Suicidal thoughts 0 0 0  PHQ-9 Score 0 0 0  Difficult doing work/chores Not difficult at all Not difficult at all Not difficult at all    BP Readings from Last 3 Encounters:  06/11/23 126/78  06/04/23 (!) 160/107  12/11/22 120/78    Physical Exam Vitals and nursing note reviewed.  Constitutional:      General: She  is not in acute distress.    Appearance: She is well-developed.  HENT:     Head: Normocephalic and atraumatic.     Right Ear: Tympanic membrane and ear canal normal.     Left Ear: Tympanic membrane and ear canal normal.     Nose:     Right Sinus: No maxillary sinus tenderness.     Left Sinus: No maxillary sinus tenderness.  Eyes:     General: No scleral icterus.       Right eye: No discharge.        Left eye: No discharge.     Conjunctiva/sclera: Conjunctivae normal.  Neck:     Thyroid: No thyromegaly.     Vascular: No carotid bruit.  Cardiovascular:     Rate and Rhythm: Normal rate and regular rhythm.     Pulses: Normal pulses.     Heart sounds: Normal heart sounds.  Pulmonary:     Effort: Pulmonary effort is normal. No respiratory distress.     Breath sounds: No wheezing or rhonchi.  Abdominal:     General: Bowel sounds are normal.     Palpations: Abdomen is soft.     Tenderness: There is no abdominal tenderness.  Musculoskeletal:        General: Normal range of motion.     Cervical back: Normal range of motion. No erythema.     Right lower leg: No edema.     Left lower leg: No edema.  Lymphadenopathy:     Cervical: No cervical adenopathy.  Skin:    General: Skin is warm and dry.     Capillary Refill: Capillary refill takes less than 2 seconds.     Findings: No rash.     Comments: Eschar on chin - resolving cellulitis  Neurological:     General: No focal deficit present.     Mental  Status: She is alert and oriented to person, place, and time.     Cranial Nerves: No cranial nerve deficit.     Sensory: No sensory deficit.     Deep Tendon Reflexes: Reflexes are normal and symmetric.  Psychiatric:        Attention and Perception: Attention normal.        Mood and Affect: Mood normal.        Behavior: Behavior normal.     Wt Readings from Last 3 Encounters:  06/11/23 257 lb 8 oz (116.8 kg)  12/11/22 256 lb (116.1 kg)  10/02/22 259 lb (117.5 kg)    BP 126/78   Pulse 89   Ht 5\' 10"  (1.778 m)   Wt 257 lb 8 oz (116.8 kg)   SpO2 96%   BMI 36.95 kg/m   Assessment and Plan:  Problem List Items Addressed This Visit       Unprioritized   Acquired hypothyroidism (Chronic)   Supplemented, Check labs and advise      Relevant Orders   TSH + free T4   Prediabetes (Chronic)   Relevant Orders   Hemoglobin A1c   Vitamin D deficiency (Chronic)   Continue daily supplements Check labs - consider high dose testing      Relevant Orders   VITAMIN D 25 Hydroxy (Vit-D Deficiency, Fractures)   Essential hypertension (Chronic)   Blood pressure is well controlled.  Current medications olmesartan and hctz Will continue same regimen along with efforts to limit dietary sodium.       Relevant Orders   CBC with Differential/Platelet   Comprehensive metabolic panel  with GFR   Urinalysis, Routine w reflex microscopic   Mixed hyperlipidemia (Chronic)   LDL is  Lab Results  Component Value Date   LDLCALC 133 (H) 06/09/2022   Current regimen is diet.  No medication side effects noted. Goal LDL is <130.       Relevant Orders   Lipid panel   Other Visit Diagnoses       Annual physical exam    -  Primary   up to date on mammo and pap refer for CRC screening Rec Tdap with any injury   Relevant Orders   CBC with Differential/Platelet   Comprehensive metabolic panel with GFR   Hemoglobin A1c   Lipid panel   TSH + free T4   Urinalysis, Routine w reflex  microscopic     Colon cancer screening       Relevant Orders   Ambulatory referral to Gastroenterology       Return in about 6 months (around 12/11/2023) for HTN.    Reubin Milan, MD [facility] [place]

## 2023-06-12 ENCOUNTER — Encounter: Payer: Self-pay | Admitting: Internal Medicine

## 2023-06-12 LAB — URINALYSIS, ROUTINE W REFLEX MICROSCOPIC
Bilirubin, UA: NEGATIVE
Glucose, UA: NEGATIVE
Ketones, UA: NEGATIVE
Leukocytes,UA: NEGATIVE
Nitrite, UA: NEGATIVE
Protein,UA: NEGATIVE
RBC, UA: NEGATIVE
Specific Gravity, UA: 1.006 (ref 1.005–1.030)
Urobilinogen, Ur: 0.2 mg/dL (ref 0.2–1.0)
pH, UA: 7 (ref 5.0–7.5)

## 2023-06-12 LAB — LIPID PANEL
Chol/HDL Ratio: 5 ratio — ABNORMAL HIGH (ref 0.0–4.4)
Cholesterol, Total: 216 mg/dL — ABNORMAL HIGH (ref 100–199)
HDL: 43 mg/dL (ref >39)
LDL Chol Calc (NIH): 147 mg/dL — ABNORMAL HIGH (ref 0–99)
Triglycerides: 141 mg/dL (ref 0–149)
VLDL Cholesterol Cal: 26 mg/dL (ref 5–40)

## 2023-06-12 LAB — CBC WITH DIFFERENTIAL/PLATELET
Basophils Absolute: 0.1 10*3/uL (ref 0.0–0.2)
Basos: 1 %
EOS (ABSOLUTE): 0.2 10*3/uL (ref 0.0–0.4)
Eos: 4 %
Hematocrit: 44.8 % (ref 34.0–46.6)
Hemoglobin: 14.7 g/dL (ref 11.1–15.9)
Immature Grans (Abs): 0 10*3/uL (ref 0.0–0.1)
Immature Granulocytes: 0 %
Lymphocytes Absolute: 1.8 10*3/uL (ref 0.7–3.1)
Lymphs: 33 %
MCH: 29.8 pg (ref 26.6–33.0)
MCHC: 32.8 g/dL (ref 31.5–35.7)
MCV: 91 fL (ref 79–97)
Monocytes Absolute: 0.4 10*3/uL (ref 0.1–0.9)
Monocytes: 7 %
Neutrophils Absolute: 3 10*3/uL (ref 1.4–7.0)
Neutrophils: 55 %
Platelets: 304 10*3/uL (ref 150–450)
RBC: 4.93 x10E6/uL (ref 3.77–5.28)
RDW: 13 % (ref 11.7–15.4)
WBC: 5.5 10*3/uL (ref 3.4–10.8)

## 2023-06-12 LAB — COMPREHENSIVE METABOLIC PANEL WITH GFR
ALT: 54 IU/L — ABNORMAL HIGH (ref 0–32)
AST: 36 IU/L (ref 0–40)
Albumin: 4.9 g/dL (ref 3.9–4.9)
Alkaline Phosphatase: 67 IU/L (ref 44–121)
BUN/Creatinine Ratio: 15 (ref 9–23)
BUN: 13 mg/dL (ref 6–24)
Bilirubin Total: 0.5 mg/dL (ref 0.0–1.2)
CO2: 22 mmol/L (ref 20–29)
Calcium: 10.1 mg/dL (ref 8.7–10.2)
Chloride: 99 mmol/L (ref 96–106)
Creatinine, Ser: 0.84 mg/dL (ref 0.57–1.00)
Globulin, Total: 2.9 g/dL (ref 1.5–4.5)
Glucose: 107 mg/dL — ABNORMAL HIGH (ref 70–99)
Potassium: 4.4 mmol/L (ref 3.5–5.2)
Sodium: 136 mmol/L (ref 134–144)
Total Protein: 7.8 g/dL (ref 6.0–8.5)
eGFR: 87 mL/min/{1.73_m2} (ref >59)

## 2023-06-12 LAB — TSH+FREE T4
Free T4: 1.58 ng/dL (ref 0.82–1.77)
TSH: 3.81 u[IU]/mL (ref 0.450–4.500)

## 2023-06-12 LAB — VITAMIN D 25 HYDROXY (VIT D DEFICIENCY, FRACTURES): Vit D, 25-Hydroxy: 33.2 ng/mL (ref 30.0–100.0)

## 2023-06-12 LAB — HEMOGLOBIN A1C
Est. average glucose Bld gHb Est-mCnc: 120 mg/dL
Hgb A1c MFr Bld: 5.8 % — ABNORMAL HIGH (ref 4.8–5.6)

## 2023-06-15 ENCOUNTER — Other Ambulatory Visit: Payer: Self-pay

## 2023-06-15 ENCOUNTER — Telehealth: Payer: Self-pay

## 2023-06-15 DIAGNOSIS — Z1211 Encounter for screening for malignant neoplasm of colon: Secondary | ICD-10-CM

## 2023-06-15 MED ORDER — NA SULFATE-K SULFATE-MG SULF 17.5-3.13-1.6 GM/177ML PO SOLN: 1.0000 | Freq: Once | ORAL | 0 refills | Status: AC

## 2023-06-15 NOTE — Telephone Encounter (Signed)
 Gastroenterology Pre-Procedure Review  Request Date: 07/17/23 Requesting Physician: Dr. Allegra Lai  PATIENT REVIEW QUESTIONS: The patient responded to the following health history questions as indicated:    1. Are you having any GI issues? no 2. Do you have a personal history of Polyps? no 3. Do you have a family history of Colon Cancer or Polyps? no 4. Diabetes Mellitus? no 5. Joint replacements in the past 12 months?no 6. Major health problems in the past 3 months?no 7. Any artificial heart valves, MVP, or defibrillator?no    MEDICATIONS & ALLERGIES:    Patient reports the following regarding taking any anticoagulation/antiplatelet therapy:   Plavix, Coumadin, Eliquis, Xarelto, Lovenox, Pradaxa, Brilinta, or Effient? no Aspirin? no  Patient confirms/reports the following medications:  Current Outpatient Medications  Medication Sig Dispense Refill   Azelastine-Fluticasone 137-50 MCG/ACT SUSP SMARTSIG:1 Spray(s) Both Nares Twice Daily PRN     Na Sulfate-K Sulfate-Mg Sulfate concentrate (SUPREP) 17.5-3.13-1.6 GM/177ML SOLN Take 1 kit (354 mLs total) by mouth once for 1 dose. 354 mL 0   Cholecalciferol 50 MCG (2000 UT) TABS Take by mouth.     doxycycline (VIBRAMYCIN) 100 MG capsule Take 1 capsule (100 mg total) by mouth 2 (two) times daily. 20 capsule 0   fluticasone (FLONASE) 50 MCG/ACT nasal spray Place 2 sprays into both nostrils daily.     levonorgestrel (LILETTA) 20.1 MCG/DAY IUD 1 each by Intrauterine route once.     levothyroxine (SYNTHROID) 125 MCG tablet TAKE 1 TABLET DAILY 90 tablet 0   olmesartan-hydrochlorothiazide (BENICAR HCT) 40-12.5 MG tablet TAKE 1 TABLET DAILY 90 tablet 1   omeprazole (PRILOSEC) 40 MG capsule Take by mouth.     No current facility-administered medications for this visit.    Patient confirms/reports the following allergies:  Allergies  Allergen Reactions   Amlodipine Swelling   Acyclovir And Related    Amoxicillin Other (See Comments)   Cefuroxime  Axetil Diarrhea and Nausea And Vomiting    Other reaction(s): Nausea    No orders of the defined types were placed in this encounter.   AUTHORIZATION INFORMATION Primary Insurance: 1D#: Group #:  Secondary Insurance: 1D#: Group #:  SCHEDULE INFORMATION: Date: 07/17/23 Time: Location: [facility]

## 2023-06-15 NOTE — Telephone Encounter (Signed)
 The patient called to schedule a colonoscopy. I informed her that if the scheduler does not answer, she should allow 24-48 hours for a return call

## 2023-06-15 NOTE — Telephone Encounter (Signed)
 Colonoscopy scheduled 07/17/23 with Dr. Allegra Lai at [facility].  Thanks, [place], [place]

## 2023-07-05 ENCOUNTER — Encounter: Payer: Self-pay | Admitting: Gastroenterology

## 2023-07-12 ENCOUNTER — Other Ambulatory Visit: Payer: Self-pay | Admitting: Internal Medicine

## 2023-07-12 DIAGNOSIS — E039 Hypothyroidism, unspecified: Secondary | ICD-10-CM

## 2023-07-13 NOTE — Telephone Encounter (Signed)
 Requested Prescriptions  Pending Prescriptions Disp Refills   levothyroxine  (SYNTHROID ) 125 MCG tablet [Pharmacy Med Name: L-THYROXINE TABS 125MCG] 90 tablet 3    Sig: TAKE 1 TABLET DAILY     Endocrinology:  Hypothyroid Agents Passed - 07/13/2023  2:30 PM      Passed - TSH in normal range and within 360 days    TSH  Date Value Ref Range Status  06/11/2023 3.810 0.450 - 4.500 uIU/mL Final         Passed - Valid encounter within last 12 months    Recent Outpatient Visits           1 month ago Annual physical exam   [facility] Medicine at [facility], Chales [place], MD       Future Appointments             In 5 months Gala Jubilee, [place]ales [place], MD [facility] Primary Care [facility] Medicine at [facility], [facility]

## 2023-07-17 ENCOUNTER — Ambulatory Visit
Admission: RE | Admit: 2023-07-17 | Discharge: 2023-07-17 | Disposition: A | Discharge status: Home / Self Care | Attending: Gastroenterology | Admitting: Gastroenterology

## 2023-07-17 ENCOUNTER — Encounter
Admission: RE | Disposition: A | Discharge status: Home / Self Care | Payer: Self-pay | Source: Home / Self Care | Attending: Gastroenterology

## 2023-07-17 ENCOUNTER — Encounter: Payer: Self-pay | Admitting: Gastroenterology

## 2023-07-17 ENCOUNTER — Ambulatory Visit: Payer: Self-pay | Admitting: Anesthesiology

## 2023-07-17 ENCOUNTER — Other Ambulatory Visit: Payer: Self-pay

## 2023-07-17 DIAGNOSIS — E039 Hypothyroidism, unspecified: Secondary | ICD-10-CM | POA: Insufficient documentation

## 2023-07-17 DIAGNOSIS — Z1211 Encounter for screening for malignant neoplasm of colon: Secondary | ICD-10-CM

## 2023-07-17 DIAGNOSIS — I10 Essential (primary) hypertension: Secondary | ICD-10-CM | POA: Diagnosis not present

## 2023-07-17 DIAGNOSIS — K219 Gastro-esophageal reflux disease without esophagitis: Secondary | ICD-10-CM | POA: Insufficient documentation

## 2023-07-17 HISTORY — DX: Essential (primary) hypertension: I10

## 2023-07-17 HISTORY — DX: Motion sickness, initial encounter: T75.3XXA

## 2023-07-17 HISTORY — PX: COLONOSCOPY: SHX5424

## 2023-07-17 HISTORY — DX: Gastro-esophageal reflux disease without esophagitis: K21.9

## 2023-07-17 LAB — POCT PREGNANCY, URINE: Preg Test, Ur: NEGATIVE

## 2023-07-17 SURGERY — COLONOSCOPY
Anesthesia: General

## 2023-07-17 MED ORDER — STERILE WATER FOR IRRIGATION IR SOLN
Status: DC | PRN
  Administered 2023-07-17: 60 mL

## 2023-07-17 MED ORDER — SODIUM CHLORIDE 0.9 % IV SOLN
INTRAVENOUS | Status: DC
Start: 2023-07-17 — End: 2023-07-17

## 2023-07-17 MED ORDER — LIDOCAINE HCL (CARDIAC) PF 100 MG/5ML IV SOSY
PREFILLED_SYRINGE | INTRAVENOUS | Status: DC | PRN
  Administered 2023-07-17: 100 mg via INTRAVENOUS

## 2023-07-17 MED ORDER — PROPOFOL 500 MG/50ML IV EMUL
INTRAVENOUS | Status: DC | PRN
  Administered 2023-07-17: 150 ug/kg/min via INTRAVENOUS

## 2023-07-17 MED ORDER — PROPOFOL 10 MG/ML IV BOLUS
INTRAVENOUS | Status: DC | PRN
  Administered 2023-07-17: 80 mg via INTRAVENOUS

## 2023-07-17 MED ORDER — STERILE WATER FOR IRRIGATION IR SOLN
Status: DC | PRN
  Administered 2023-07-17: 1

## 2023-07-17 SURGICAL SUPPLY — 5 items
GAUZE SPONGE 4X4 12PLY STRL (GAUZE/BANDAGES/DRESSINGS) IMPLANT
GOWN CVR UNV OPN BCK APRN NK (MISCELLANEOUS) ×2 IMPLANT
KIT PRC NS LF DISP ENDO (KITS) ×1 IMPLANT
MANIFOLD NEPTUNE II (INSTRUMENTS) ×1 IMPLANT
WATER STERILE IRR 250ML POUR (IV SOLUTION) ×1 IMPLANT

## 2023-07-17 NOTE — Transfer of Care (Signed)
 Immediate Anesthesia Transfer of Care Note  Patient: Alicia Horn  Procedure(s) Performed: COLONOSCOPY  Patient Location: PACU  Anesthesia Type:General  Level of Consciousness: awake, alert , and oriented  Airway & Oxygen Therapy: Patient Spontanous Breathing  Post-op Assessment: Report given to RN and Post -op Vital signs reviewed and stable  Post vital signs: Reviewed and stable  Last Vitals:  Vitals Value Taken Time  BP 97/69 07/17/23 1032  Temp 36.6 C 07/17/23 1029  Pulse 81 07/17/23 1033  Resp 20 07/17/23 1033  SpO2 97 % 07/17/23 1033  Vitals shown include unfiled device data.  Last Pain:  Vitals:   07/17/23 1029  TempSrc:   PainSc: 0-No pain      Patients Stated Pain Goal: 0 (07/17/23 0908)  Complications: No notable events documented.

## 2023-07-17 NOTE — H&P (Signed)
 Alicia Oz, MD [address]  [address]  [place], [place] [address]  Main: [phone]  Fax: [phone] Pager: [phone]  Primary Care Physician:  Alicia Dixons, MD Primary Gastroenterologist:  Dr. Karma Horn  Pre-Procedure History & Physical: HPI:  Alicia Horn is a 43 y.o. female is here for an colonoscopy.   Past Medical History:  Diagnosis Date   Blood glucose elevated 02/03/2015   A1C 6.0 in 2012 - monitored regularly by GYN    Car sickness    GERD (gastroesophageal reflux disease)    Hypertension    Hypothyroidism    Prediabetes 03/02/2015   Resolved with weight loss   Thyroid  disease    Umbilical granuloma    Removed surgically    Past Surgical History:  Procedure Laterality Date   EXPLORATORY LAPAROTOMY WITH ABDOMINAL MASS EXCISION N/A 10/14/2015   excision of umbilical granuloma   UMBILICAL GRANULOMA EXCISION  10/2015   WISDOM TOOTH EXTRACTION      Prior to Admission medications   Medication Sig Start Date End Date Taking? Authorizing Provider  Azelastine-Fluticasone 137-50 MCG/ACT SUSP SMARTSIG:1 Spray(s) Both Nares Twice Daily PRN 01/18/23  Yes [provider]  Cholecalciferol 50 MCG (2000 UT) TABS Take by mouth.   Yes [provider]  levothyroxine  (SYNTHROID ) 125 MCG tablet TAKE 1 TABLET DAILY 07/13/23  Yes Alicia Dixons, MD  olmesartan -hydrochlorothiazide (BENICAR  HCT) 40-12.5 MG tablet TAKE 1 TABLET DAILY 04/17/23  Yes Berglund, Laura H, MD  omeprazole (PRILOSEC) 40 MG capsule Take by mouth. 06/05/23 06/04/24 Yes [provider]  doxycycline  (VIBRAMYCIN ) 100 MG capsule Take 1 capsule (100 mg total) by mouth 2 (two) times daily. Patient not taking: Reported on 07/05/2023 06/04/23   Brimage, Vondra, DO  fluticasone (FLONASE) 50 MCG/ACT nasal spray Place 2 sprays into both nostrils daily. Patient not taking: Reported on 07/05/2023    [provider]  levonorgestrel  (LILETTA ) 20.1 MCG/DAY IUD 1 each by  Intrauterine route once.    [provider]    Allergies as of 06/15/2023 - Review Complete 06/15/2023  Allergen Reaction Noted   Amlodipine  Swelling 08/14/2022   Acyclovir and related  10/14/2015   Amoxicillin Other (See Comments) 03/02/2015   Cefuroxime axetil Diarrhea and Nausea And Vomiting 02/03/2015    Family History  Problem Relation Age of Onset   Hyperlipidemia Father    CAD Father    Breast cancer Paternal Grandmother     Social History   Socioeconomic History   Marital status: Married    Spouse name: Not on file   Number of children: Not on file   Years of education: Not on file   Highest education level: Bachelor's degree (e.g., BA, AB, BS)  Occupational History   Not on file  Tobacco Use   Smoking status: Never   Smokeless tobacco: Never  Substance and Sexual Activity   Alcohol use: Yes    Alcohol/week: 2.0 standard drinks of alcohol    Types: 2 Standard drinks or equivalent per week   Drug use: Not on file   Sexual activity: Not on file  Other Topics Concern   Not on file  Social History Narrative   Not on file   Social Drivers of Health   Financial Resource Strain: Low Risk  (06/05/2023)   Received from [facility]   Overall Financial Resource Strain (CARDIA)    Difficulty of Paying Living Expenses: Not hard at all  Food Insecurity: No Food Insecurity (06/05/2023)  Received from [facility]   Hunger Vital Sign    Worried About Running Out of Food in the Last Year: Never true    Ran Out of Food in the Last Year: Never true  Transportation Needs: No Transportation Needs (06/05/2023)   Received from [facility] - Transportation    In the past 12 months, has lack of transportation kept you from medical appointments or from getting medications?: No    Lack of Transportation (Non-Medical): No  Physical Activity: Insufficiently Active (07/14/2022)   Exercise Vital Sign    Days of  Exercise per Week: 2 days    Minutes of Exercise per Session: 30 min  Stress: No Stress Concern Present (07/14/2022)   [employer] of Occupational Health - Occupational Stress Questionnaire    Feeling of Stress : Not at all  Social Connections: Unknown (07/14/2022)   Social Connection and Isolation Panel [NHANES]    Frequency of Communication with Friends and Family: More than three times a week    Frequency of Social Gatherings with Friends and Family: Twice a week    Attends Religious Services: More than 4 times per year    Active Member of [employer] or Organizations: Patient declined    Attends Engineer, structural: Not on file    Marital Status: Married  Catering manager Violence: Not on file    Review of Systems: See HPI, otherwise negative ROS  Physical Exam: Pulse 90   Temp 97.9 F (36.6 C) (Temporal)   Resp 16   Ht 5\' 10"  (1.778 m)   Wt 111.8 kg   SpO2 96%   BMI 35.37 kg/m  General:   Alert,  pleasant and cooperative in NAD Head:  Normocephalic and atraumatic. Neck:  Supple; no masses or thyromegaly. Lungs:  Clear throughout to auscultation.    Heart:  Regular rate and rhythm. Abdomen:  Soft, nontender and nondistended. Normal bowel sounds, without guarding, and without rebound.   Neurologic:  Alert and  oriented x4;  grossly normal neurologically.  Impression/Plan: Alicia Horn is here for an colonoscopy to be performed for colon cancer screening  Risks, benefits, limitations, and alternatives regarding  colonoscopy have been reviewed with the patient.  Questions have been answered.  All parties agreeable.   Ellis Guys, MD  07/17/2023, 9:13 AM

## 2023-07-17 NOTE — Op Note (Signed)
 [facility] Patient Name: Alicia Horn Procedure Date: 07/17/2023 9:52 AM MRN: [id number] Account #: [id number] Date of Birth: 1981/02/14 Admit Type: Outpatient Age: 43 Room: [facility] OR ROOM 01 Gender: Female Note Status: Finalized Instrument Name: [id number] Procedure:             Colonoscopy Indications:           Screening for colorectal malignant neoplasm, This is                         the patient's first colonoscopy Providers:             Selena Daily MD, MD Referring MD:          Janna Melter, MD (Referring MD) Medicines:             General Anesthesia Complications:         No immediate complications. Estimated blood loss: None. Procedure:             Pre-Anesthesia Assessment:                        - Prior to the procedure, a History and Physical was                         performed, and patient medications and allergies were                         reviewed. The patient is competent. The risks and                         benefits of the procedure and the sedation options and                         risks were discussed with the patient. All questions                         were answered and informed consent was obtained.                         Patient identification and proposed procedure were                         verified by the physician, the nurse, the                         anesthesiologist, the anesthetist and the technician                         in the pre-procedure area in the procedure room in the                         endoscopy suite. Mental Status Examination: alert and                         oriented. Airway Examination: normal oropharyngeal                         airway and neck mobility. Respiratory Examination:  clear to auscultation. CV Examination: normal.                         Prophylactic Antibiotics: The patient does not require                         prophylactic  antibiotics. Prior Anticoagulants: The                         patient has taken no anticoagulant or antiplatelet                         agents. ASA Grade Assessment: II - A patient with mild                         systemic disease. After reviewing the risks and                         benefits, the patient was deemed in satisfactory                         condition to undergo the procedure. The anesthesia                         plan was to use general anesthesia. Immediately prior                         to administration of medications, the patient was                         re-assessed for adequacy to receive sedatives. The                         heart rate, respiratory rate, oxygen saturations,                         blood pressure, adequacy of pulmonary ventilation, and                         response to care were monitored throughout the                         procedure. The physical status of the patient was                         re-assessed after the procedure.                        After obtaining informed consent, the colonoscope was                         passed under direct vision. Throughout the procedure,                         the patient's blood pressure, pulse, and oxygen                         saturations were monitored continuously. The  Colonoscope was introduced through the anus and                         advanced to the the cecum, identified by appendiceal                         orifice and ileocecal valve. The colonoscopy was                         performed without difficulty. The patient tolerated                         the procedure well. The quality of the bowel                         preparation was evaluated using the BBPS [facility] Bowel                         Preparation Scale) with scores of: Right Colon = 3,                         Transverse Colon = 3 and Left Colon = 3 (entire mucosa                         seen  well with no residual staining, small fragments                         of stool or opaque liquid). The total BBPS score                         equals 9. The ileocecal valve, appendiceal orifice,                         and rectum were photographed. Findings:      The perianal and digital rectal examinations were normal. Pertinent       negatives include normal sphincter tone and no palpable rectal lesions.      The entire examined colon appeared normal.      The retroflexed view of the distal rectum and anal verge was normal and       showed no anal or rectal abnormalities. Impression:            - The entire examined colon is normal.                        - The distal rectum and anal verge are normal on                         retroflexion view.                        - No specimens collected. Recommendation:        - Discharge patient to home (with escort).                        - Resume previous diet today.                        -  Continue present medications.                        - Repeat colonoscopy in 10 years for screening                         purposes. Procedure Code(s):     --- Professional ---                        W0981, Colorectal cancer screening; colonoscopy on                         individual not meeting criteria for high risk Diagnosis Code(s):     --- Professional ---                        Z12.11, Encounter for screening for malignant neoplasm                         of colon CPT copyright 2022 American Medical Association. All rights reserved. The codes documented in this report are preliminary and upon coder review may  be revised to meet current compliance requirements. Dr. Evia Hof Selena Daily MD, MD 07/17/2023 10:26:27 AM This report has been signed electronically. Number of Addenda: 0 Note Initiated On: 07/17/2023 9:52 AM Scope Withdrawal Time: 0 hours 9 minutes 39 seconds  Total Procedure Duration: 0 hours 13 minutes 55 seconds   Estimated Blood Loss:  Estimated blood loss: none.      [facility]

## 2023-07-17 NOTE — Anesthesia Preprocedure Evaluation (Addendum)
 Anesthesia Evaluation  Patient identified by MRN, date of birth, ID band Patient awake    Reviewed: Allergy & Precautions, H&P , NPO status , Patient's Chart, lab work & pertinent test results  Airway Mallampati: III  TM Distance: >3 FB Neck ROM: Full    Dental no notable dental hx.    Pulmonary neg pulmonary ROS   Pulmonary exam normal breath sounds clear to auscultation       Cardiovascular hypertension, Normal cardiovascular exam Rhythm:Regular Rate:Normal     Neuro/Psych  Headaches negative neurological ROS  negative psych ROS   GI/Hepatic negative GI ROS, Neg liver ROS,GERD  ,,  Endo/Other  negative endocrine ROSHypothyroidism    Renal/GU negative Renal ROS  negative genitourinary   Musculoskeletal negative musculoskeletal ROS (+)    Abdominal   Peds negative pediatric ROS (+)  Hematology negative hematology ROS (+)   Anesthesia Other Findings Thyroid  disease  Hypothyroidism Prediabetes  Blood glucose elevated Umbilical granuloma  Hypertension GERD (gastroesophageal reflux disease)  Car sickness Allergic rhinitis    Reproductive/Obstetrics negative OB ROS                             Anesthesia Physical Anesthesia Plan  ASA: 2  Anesthesia Plan: General   Post-op Pain Management:    Induction: Intravenous  PONV Risk Score and Plan:   Airway Management Planned: Natural Airway and Nasal Cannula  Additional Equipment:   Intra-op Plan:   Post-operative Plan:   Informed Consent: I have reviewed the patients History and Physical, chart, labs and discussed the procedure including the risks, benefits and alternatives for the proposed anesthesia with the patient or authorized representative who has indicated his/her understanding and acceptance.     Dental Advisory Given  Plan Discussed with: Anesthesiologist, CRNA and Surgeon  Anesthesia Plan Comments: (Patient  consented for risks of anesthesia including but not limited to:  - adverse reactions to medications - risk of airway placement if required - damage to eyes, teeth, lips or other oral mucosa - nerve damage due to positioning  - sore throat or hoarseness - Damage to heart, brain, nerves, lungs, other parts of body or loss of life  Patient voiced understanding and assent.)        Anesthesia Quick Evaluation

## 2023-07-17 NOTE — Anesthesia Postprocedure Evaluation (Signed)
 Anesthesia Post Note  Patient: Alicia Horn  Procedure(s) Performed: COLONOSCOPY  Patient location during evaluation: PACU Anesthesia Type: General Level of consciousness: awake and alert Pain management: pain level controlled Vital Signs Assessment: post-procedure vital signs reviewed and stable Respiratory status: spontaneous breathing, nonlabored ventilation, respiratory function stable and patient connected to nasal cannula oxygen Cardiovascular status: blood pressure returned to baseline and stable Postop Assessment: no apparent nausea or vomiting Anesthetic complications: no   No notable events documented.   Last Vitals:  Vitals:   07/17/23 1029 07/17/23 1039  BP: 97/69 108/70  Pulse: 82 79  Resp: 14 (!) 22  Temp: 36.6 C 36.6 C  SpO2: 100% 98%    Last Pain:  Vitals:   07/17/23 1039  TempSrc:   PainSc: 0-No pain                 Heinz Eckert C Tawfiq Favila

## 2023-08-02 DIAGNOSIS — R053 Chronic cough: Secondary | ICD-10-CM | POA: Diagnosis not present

## 2023-08-02 DIAGNOSIS — J301 Allergic rhinitis due to pollen: Secondary | ICD-10-CM | POA: Diagnosis not present

## 2023-08-02 DIAGNOSIS — R058 Other specified cough: Secondary | ICD-10-CM | POA: Diagnosis not present

## 2023-08-02 DIAGNOSIS — K219 Gastro-esophageal reflux disease without esophagitis: Secondary | ICD-10-CM | POA: Diagnosis not present

## 2023-08-02 DIAGNOSIS — R0602 Shortness of breath: Secondary | ICD-10-CM | POA: Diagnosis not present

## 2023-10-18 ENCOUNTER — Other Ambulatory Visit: Payer: Self-pay | Admitting: Internal Medicine

## 2023-10-18 DIAGNOSIS — I10 Essential (primary) hypertension: Secondary | ICD-10-CM

## 2023-10-22 NOTE — Telephone Encounter (Signed)
 Requested Prescriptions  Pending Prescriptions Disp Refills   olmesartan -hydrochlorothiazide (BENICAR  HCT) 40-12.5 MG tablet [Pharmacy Med Name: OLMESARTAN /HCTZ TABS 40/12.5MG ] 90 tablet 0    Sig: TAKE 1 TABLET DAILY     Cardiovascular: ARB + Diuretic Combos Passed - 10/22/2023  8:45 AM      Passed - K in normal range and within 180 days    Potassium  Date Value Ref Range Status  06/11/2023 4.4 3.5 - 5.2 mmol/L Final         Passed - Na in normal range and within 180 days    Sodium  Date Value Ref Range Status  06/11/2023 136 134 - 144 mmol/L Final         Passed - Cr in normal range and within 180 days    Creatinine, Ser  Date Value Ref Range Status  06/11/2023 0.84 0.57 - 1.00 mg/dL Final         Passed - eGFR is 10 or above and within 180 days    GFR calc Af Amer  Date Value Ref Range Status  01/02/2018 127 >59 mL/min/1.73 Final   GFR calc non Af Amer  Date Value Ref Range Status  01/02/2018 110 >59 mL/min/1.73 Final   eGFR  Date Value Ref Range Status  06/11/2023 87 >59 mL/min/1.73 Final         Passed - Patient is not pregnant      Passed - Last BP in normal range    BP Readings from Last 1 Encounters:  07/17/23 108/70         Passed - Valid encounter within last 6 months    Recent Outpatient Visits           4 months ago Annual physical exam   [facility] at [facility], Leita DEL, MD       Future Appointments             In 1 month Justus Leita DEL, MD [facility] at [facility]

## 2023-12-11 ENCOUNTER — Encounter: Payer: Self-pay | Admitting: Internal Medicine

## 2023-12-11 ENCOUNTER — Ambulatory Visit: Admitting: Internal Medicine

## 2023-12-11 VITALS — BP 118/78 | HR 86 | Ht 70.0 in | Wt 240.0 lb

## 2023-12-11 DIAGNOSIS — I10 Essential (primary) hypertension: Secondary | ICD-10-CM

## 2023-12-11 DIAGNOSIS — R7303 Prediabetes: Secondary | ICD-10-CM | POA: Diagnosis not present

## 2023-12-11 DIAGNOSIS — Z1231 Encounter for screening mammogram for malignant neoplasm of breast: Secondary | ICD-10-CM | POA: Diagnosis not present

## 2023-12-11 DIAGNOSIS — R7989 Other specified abnormal findings of blood chemistry: Secondary | ICD-10-CM | POA: Diagnosis not present

## 2023-12-11 MED ORDER — OLMESARTAN MEDOXOMIL-HCTZ 40-12.5 MG PO TABS: 1.0000 | ORAL_TABLET | Freq: Every day | ORAL | 1 refills | Status: AC

## 2023-12-11 NOTE — Assessment & Plan Note (Signed)
 Well controlled blood pressure today. Current regimen is olmesartan  and hydrochlorothiazide. No medication side effects noted.

## 2023-12-11 NOTE — Patient Instructions (Signed)
 Call [facility] to schedule your mammogram at [phone].

## 2023-12-11 NOTE — Assessment & Plan Note (Signed)
 Continue healthy diet; add exercise Lab Results  Component Value Date   HGBA1C 5.8 (H) 06/11/2023

## 2023-12-11 NOTE — Progress Notes (Signed)
 Date:  12/11/2023   Name:  Alicia Horn   DOB:  [birth year]   MRN:  [id number]   Chief Complaint: Hypertension  Hypertension This is a chronic problem. The problem is controlled. Pertinent negatives include no chest pain, headaches, palpitations or shortness of breath. Past treatments include angiotensin blockers and diuretics. The current treatment provides significant improvement. There is no history of kidney disease, CAD/MI or CVA.  Elevated LFT - noted mild increase last visit.  No clear cause.  She drinks rarely and does not take Tylenol  regularly.   Review of Systems  Constitutional:  Negative for fatigue and unexpected weight change.  HENT:  Negative for trouble swallowing.   Eyes:  Negative for visual disturbance.  Respiratory:  Negative for cough, chest tightness, shortness of breath and wheezing.   Cardiovascular:  Negative for chest pain, palpitations and leg swelling.  Gastrointestinal:  Negative for abdominal pain, constipation and diarrhea.  Musculoskeletal:  Negative for arthralgias and myalgias.  Neurological:  Negative for dizziness, weakness, light-headedness and headaches.     Lab Results  Component Value Date   NA 136 06/11/2023   K 4.4 06/11/2023   CO2 22 06/11/2023   GLUCOSE 107 (H) 06/11/2023   BUN 13 06/11/2023   CREATININE 0.84 06/11/2023   CALCIUM 10.1 06/11/2023   EGFR 87 06/11/2023   GFRNONAA 110 01/02/2018   Lab Results  Component Value Date   CHOL 216 (H) 06/11/2023   HDL 43 06/11/2023   LDLCALC 147 (H) 06/11/2023   TRIG 141 06/11/2023   CHOLHDL 5.0 (H) 06/11/2023   Lab Results  Component Value Date   TSH 3.810 06/11/2023   Lab Results  Component Value Date   HGBA1C 5.8 (H) 06/11/2023   Lab Results  Component Value Date   WBC 5.5 06/11/2023   HGB 14.7 06/11/2023   HCT 44.8 06/11/2023   MCV 91 06/11/2023   PLT 304 06/11/2023   Lab Results  Component Value Date   ALT 54 (H) 06/11/2023   AST 36 06/11/2023   ALKPHOS 67  06/11/2023   BILITOT 0.5 06/11/2023   Lab Results  Component Value Date   VD25OH 33.2 06/11/2023     Patient Active Problem List   Diagnosis Date Noted   Encounter for screening colonoscopy 07/17/2023   Mixed hyperlipidemia 08/15/2022   Essential hypertension 07/18/2022   Migraine without aura and without status migrainosus, not intractable 07/18/2022   Vitamin D  deficiency 12/15/2019   B12 nutritional deficiency 12/15/2019   Family history of BRCA gene mutation 11/17/2017   Pap smear abnormality of cervix with LGSIL 06/08/2016   Umbilical hernia without obstruction and without gangrene    Prediabetes 03/02/2015   Acquired hypothyroidism 02/03/2015   BMI 35.0-35.9,adult 05/26/2014    Allergies  Allergen Reactions   Amlodipine  Swelling   Acyclovir And Related    Amoxicillin Other (See Comments)   Cefuroxime Axetil Diarrhea and Nausea And Vomiting    Other reaction(s): Nausea    Past Surgical History:  Procedure Laterality Date   COLONOSCOPY N/A 07/17/2023   Procedure: COLONOSCOPY;  Surgeon: Unk Corinn Skiff, MD;  Location: [facility];  Service: Endoscopy;  Laterality: N/A;   EXPLORATORY LAPAROTOMY WITH ABDOMINAL MASS EXCISION N/A 10/14/2015   excision of umbilical granuloma   UMBILICAL GRANULOMA EXCISION  10/2015   WISDOM TOOTH EXTRACTION      Social History   Tobacco Use   Smoking status: Never   Smokeless tobacco: Never  Substance Use Topics   Alcohol  use: Yes    Alcohol/week: 2.0 standard drinks of alcohol    Types: 2 Standard drinks or equivalent per week     Medication list has been reviewed and updated.  Current Meds  Medication Sig   Azelastine-Fluticasone 137-50 MCG/ACT SUSP SMARTSIG:1 Spray(s) Both Nares Twice Daily PRN   levonorgestrel  (LILETTA ) 20.1 MCG/DAY IUD 1 each by Intrauterine route once.   levothyroxine  (SYNTHROID ) 125 MCG tablet TAKE 1 TABLET DAILY   omeprazole (PRILOSEC) 40 MG capsule Take by mouth.   [DISCONTINUED]  olmesartan -hydrochlorothiazide (BENICAR  HCT) 40-12.5 MG tablet TAKE 1 TABLET DAILY       12/11/2023    8:40 AM 06/11/2023    9:12 AM 12/11/2022    9:38 AM 10/02/2022    3:21 PM  GAD 7 : Generalized Anxiety Score  Nervous, Anxious, on Edge 0 0 0 0  Control/stop worrying 0 0 0 0  Worry too much - different things 0 0 0 0  Trouble relaxing 0 0 0 0  Restless 0 0 0 0  Easily annoyed or irritable 0 0 0 0  Afraid - awful might happen 0 0 0 0  Total GAD 7 Score 0 0 0 0  Anxiety Difficulty Not difficult at all Not difficult at all Not difficult at all Not difficult at all       12/11/2023    8:40 AM 06/11/2023    9:12 AM 12/11/2022    9:38 AM  Depression screen PHQ 2/9  Decreased Interest 0 0 0  Down, Depressed, Hopeless 0 0 0  PHQ - 2 Score 0 0 0  Altered sleeping 0 0 0  Tired, decreased energy 0 0 0  Change in appetite 0 0 0  Feeling bad or failure about yourself  0 0 0  Trouble concentrating 0 0 0  Moving slowly or fidgety/restless 0 0 0  Suicidal thoughts 0 0 0  PHQ-9 Score 0 0 0  Difficult doing work/chores Not difficult at all Not difficult at all Not difficult at all    BP Readings from Last 3 Encounters:  12/11/23 118/78  07/17/23 108/70  06/11/23 126/78    Physical Exam Vitals and nursing note reviewed.  Constitutional:      General: She is not in acute distress.    Appearance: Normal appearance. She is well-developed.  HENT:     Head: Normocephalic and atraumatic.  Neck:     Vascular: No carotid bruit.  Cardiovascular:     Rate and Rhythm: Normal rate and regular rhythm.     Heart sounds: No murmur heard. Pulmonary:     Effort: Pulmonary effort is normal. No respiratory distress.     Breath sounds: No wheezing or rhonchi.  Musculoskeletal:     Cervical back: Normal range of motion.     Right lower leg: No edema.     Left lower leg: No edema.  Lymphadenopathy:     Cervical: No cervical adenopathy.  Skin:    General: Skin is warm and dry.     Findings: No  rash.  Neurological:     Mental Status: She is alert and oriented to person, place, and time.  Psychiatric:        Mood and Affect: Mood normal.        Behavior: Behavior normal.     Wt Readings from Last 3 Encounters:  12/11/23 240 lb (108.9 kg)  07/17/23 246 lb 8 oz (111.8 kg)  06/11/23 257 lb 8 oz (116.8 kg)  BP 118/78   Pulse 86   Ht 5' 10 (1.778 m)   Wt 240 lb (108.9 kg)   SpO2 96%   BMI 34.44 kg/m   Assessment and Plan:  Problem List Items Addressed This Visit       Unprioritized   Essential hypertension - Primary (Chronic)   Well controlled blood pressure today. Current regimen is olmesartan  and hydrochlorothiazide. No medication side effects noted.        Relevant Medications   olmesartan -hydrochlorothiazide (BENICAR  HCT) 40-12.5 MG tablet   Prediabetes (Chronic)   Continue healthy diet; add exercise Lab Results  Component Value Date   HGBA1C 5.8 (H) 06/11/2023        Relevant Orders   Hemoglobin A1c   Other Visit Diagnoses       Elevated liver function tests       no clear cause will repeat today then advise   Relevant Orders   Hepatic function panel     Encounter for screening mammogram for breast cancer       patient to schedule in December at [facility]   Relevant Orders   MM 3D SCREENING MAMMOGRAM BILATERAL BREAST      She will call back to schedule her annual CPX with new PCP.  Return in about 6 months (around 06/10/2024) for CPX.    Leita HILARIO Adie, MD [facility] [place]

## 2023-12-12 ENCOUNTER — Ambulatory Visit: Payer: Self-pay | Admitting: Internal Medicine

## 2023-12-12 LAB — HEPATIC FUNCTION PANEL
ALT: 26 IU/L (ref 0–32)
AST: 19 IU/L (ref 0–40)
Albumin: 4.9 g/dL (ref 3.9–4.9)
Alkaline Phosphatase: 66 IU/L (ref 41–116)
Bilirubin Total: 0.4 mg/dL (ref 0.0–1.2)
Bilirubin, Direct: 0.13 mg/dL (ref 0.00–0.40)
Total Protein: 7.4 g/dL (ref 6.0–8.5)

## 2023-12-12 LAB — HEMOGLOBIN A1C
Est. average glucose Bld gHb Est-mCnc: 117 mg/dL
Hgb A1c MFr Bld: 5.7 % — ABNORMAL HIGH (ref 4.8–5.6)

## 2024-01-22 DIAGNOSIS — Z1331 Encounter for screening for depression: Secondary | ICD-10-CM | POA: Diagnosis not present

## 2024-01-22 DIAGNOSIS — Z01411 Encounter for gynecological examination (general) (routine) with abnormal findings: Secondary | ICD-10-CM | POA: Diagnosis not present

## 2024-02-11 ENCOUNTER — Ambulatory Visit
Admission: RE | Admit: 2024-02-11 | Discharge: 2024-02-11 | Disposition: A | Source: Ambulatory Visit | Attending: Internal Medicine | Admitting: Internal Medicine

## 2024-02-11 DIAGNOSIS — Z1231 Encounter for screening mammogram for malignant neoplasm of breast: Secondary | ICD-10-CM | POA: Diagnosis not present

## 2024-06-04 ENCOUNTER — Encounter: Admitting: Family Medicine
